# Patient Record
Sex: Female | Born: 1970 | Race: White | Hispanic: No | Smoking: Former smoker
Health system: Southern US, Community
[De-identification: ages and names within clinical notes are randomized; demographics above are authoritative.]

## PROBLEM LIST (undated history)

## (undated) DIAGNOSIS — E041 Nontoxic single thyroid nodule: Secondary | ICD-10-CM

## (undated) DIAGNOSIS — E039 Hypothyroidism, unspecified: Secondary | ICD-10-CM

## (undated) DIAGNOSIS — H9202 Otalgia, left ear: Secondary | ICD-10-CM

## (undated) DIAGNOSIS — N83209 Unspecified ovarian cyst, unspecified side: Secondary | ICD-10-CM

## (undated) DIAGNOSIS — T1490XA Injury, unspecified, initial encounter: Secondary | ICD-10-CM

## (undated) DIAGNOSIS — F909 Attention-deficit hyperactivity disorder, unspecified type: Secondary | ICD-10-CM

## (undated) DIAGNOSIS — R0602 Shortness of breath: Secondary | ICD-10-CM

## (undated) DIAGNOSIS — Z1509 Genetic susceptibility to other malignant neoplasm: Secondary | ICD-10-CM

## (undated) DIAGNOSIS — G43909 Migraine, unspecified, not intractable, without status migrainosus: Secondary | ICD-10-CM

## (undated) HISTORY — DX: Migraine, unspecified, not intractable, without status migrainosus: G43.909

## (undated) HISTORY — DX: Nontoxic single thyroid nodule: E04.1

## (undated) HISTORY — DX: Genetic susceptibility to other malignant neoplasm: Z15.09

## (undated) HISTORY — DX: Hypothyroidism, unspecified: E03.9

## (undated) HISTORY — DX: Unspecified ovarian cyst, unspecified side: N83.209

## (undated) HISTORY — PX: OTHER SURGICAL HISTORY: SHX169

---

## 1998-10-14 ENCOUNTER — Emergency Department (HOSPITAL_COMMUNITY): Admission: EM | Admit: 1998-10-14 | Discharge: 1998-10-14 | Payer: Self-pay | Admitting: Emergency Medicine

## 2000-12-27 ENCOUNTER — Encounter: Payer: Self-pay | Admitting: Family Medicine

## 2000-12-27 ENCOUNTER — Encounter: Admission: RE | Admit: 2000-12-27 | Discharge: 2000-12-27 | Payer: Self-pay | Admitting: Family Medicine

## 2001-04-23 HISTORY — PX: TUBAL LIGATION: SHX77

## 2001-09-24 ENCOUNTER — Other Ambulatory Visit: Admission: RE | Admit: 2001-09-24 | Discharge: 2001-09-24 | Payer: Self-pay | Admitting: Obstetrics and Gynecology

## 2002-01-22 ENCOUNTER — Encounter: Payer: Self-pay | Admitting: Obstetrics and Gynecology

## 2002-01-22 ENCOUNTER — Ambulatory Visit (HOSPITAL_COMMUNITY): Admission: RE | Admit: 2002-01-22 | Discharge: 2002-01-22 | Payer: Self-pay | Admitting: Obstetrics and Gynecology

## 2002-03-23 ENCOUNTER — Inpatient Hospital Stay (HOSPITAL_COMMUNITY): Admission: AD | Admit: 2002-03-23 | Discharge: 2002-03-25 | Payer: Self-pay | Admitting: Obstetrics and Gynecology

## 2002-03-23 ENCOUNTER — Encounter (INDEPENDENT_AMBULATORY_CARE_PROVIDER_SITE_OTHER): Payer: Self-pay | Admitting: Specialist

## 2002-04-23 HISTORY — PX: VEIN SURGERY: SHX48

## 2002-06-25 ENCOUNTER — Other Ambulatory Visit: Admission: RE | Admit: 2002-06-25 | Discharge: 2002-06-25 | Payer: Self-pay | Admitting: Obstetrics and Gynecology

## 2005-04-23 HISTORY — PX: ENDOMETRIAL ABLATION: SHX621

## 2007-03-26 ENCOUNTER — Encounter: Admission: RE | Admit: 2007-03-26 | Discharge: 2007-03-26 | Payer: Self-pay | Admitting: Gastroenterology

## 2010-02-20 ENCOUNTER — Emergency Department (HOSPITAL_BASED_OUTPATIENT_CLINIC_OR_DEPARTMENT_OTHER): Admission: EM | Admit: 2010-02-20 | Discharge: 2010-02-20 | Payer: Self-pay | Admitting: Emergency Medicine

## 2010-02-20 ENCOUNTER — Ambulatory Visit: Payer: Self-pay | Admitting: Diagnostic Radiology

## 2010-09-08 NOTE — Op Note (Signed)
   NAME:  [name], Jordan L                         ACCOUNT NO.:  [id number]   MEDICAL RECORD NO.:  [id number]                   PATIENT TYPE:  INP   LOCATION:  9118                                 FACILITY:  [facility]   PHYSICIAN:  Michelle L. Vincente Poli, M.D.            DATE OF BIRTH:  September 26, 1970   DATE OF PROCEDURE:  03/23/2002  DATE OF DISCHARGE:                                 OPERATIVE REPORT   PREOPERATIVE DIAGNOSIS:  Multiparity, desires permanent sterilization.   POSTOPERATIVE DIAGNOSIS:  Multiparity, desires permanent sterilization.   PROCEDURE:  Modified Pomeroy bilateral tubal ligation.   SURGEON:  Michelle L. Vincente Poli, M.D.   ANESTHESIA:  Epidural.   ESTIMATED BLOOD LOSS:  Minimal.   DESCRIPTION OF PROCEDURE:  The patient was taken to the operating room.  Her  epidural was dosed.  The abdomen was prepped and draped in the usual sterile  fashion after an in-and-out catheter was used to empty the bladder.  Using  the scalpel, small infraumbilical incision was made and the fascia was  picked up and entered using Mayo scissors.  After the peritoneum was then  opened sharply, we then used direct visualization to initially grasp the  left fallopian tube with a Babcock clamp.  It was followed to the fimbriated  end.  The midportion of the tube was then identified and lifted up using a  Babcock clamp, and a 3 cm knuckle was tied off using plain gut suture x2.  The knuckle was excised.  It was inspected and noted to be hemostatic.  The  tube was returned to the abdomen and in a likewise fashion, the right  fallopian tube was identified.  The fimbriated end was visualized easily.  The midportion of the tube was grasped using a Babcock clamp.  A 3 cm  knuckle was tied off using plain gut suture x2, and the knuckle was excised  using Metzenbaum scissors.  The tube was inspected and noted to be  hemostatic and was returned to the abdomen.  The fascia was closed using 0  Vicryl in a continuous  running stitch and the subcutaneous layer was closed,  using 3-0 Vicryl in a subcuticular stitch.  At the end of the procedure all  sponge, lap, and instrument counts were correct x2.  The patient tolerated  the procedure well and went to the recovery room in stable condition.                                               Michelle L. Vincente Poli, M.D.    [name]  D:  03/23/2002  T:  03/24/2002  Job:  [id number]

## 2011-02-18 DIAGNOSIS — T1490XA Injury, unspecified, initial encounter: Secondary | ICD-10-CM

## 2011-02-18 HISTORY — DX: Injury, unspecified, initial encounter: T14.90XA

## 2011-04-30 ENCOUNTER — Other Ambulatory Visit (HOSPITAL_COMMUNITY): Payer: Self-pay | Admitting: Obstetrics and Gynecology

## 2011-04-30 DIAGNOSIS — E041 Nontoxic single thyroid nodule: Secondary | ICD-10-CM

## 2011-05-02 ENCOUNTER — Ambulatory Visit (HOSPITAL_COMMUNITY)
Admission: RE | Admit: 2011-05-02 | Discharge: 2011-05-02 | Disposition: A | Discharge status: Home / Self Care | Payer: BC Managed Care – PPO | Source: Ambulatory Visit | Attending: Obstetrics and Gynecology | Admitting: Obstetrics and Gynecology

## 2011-05-02 DIAGNOSIS — R05 Cough: Secondary | ICD-10-CM | POA: Insufficient documentation

## 2011-05-02 DIAGNOSIS — E041 Nontoxic single thyroid nodule: Secondary | ICD-10-CM

## 2011-05-02 DIAGNOSIS — R059 Cough, unspecified: Secondary | ICD-10-CM | POA: Insufficient documentation

## 2011-05-08 ENCOUNTER — Other Ambulatory Visit: Payer: Self-pay | Admitting: Obstetrics and Gynecology

## 2011-05-08 DIAGNOSIS — E041 Nontoxic single thyroid nodule: Secondary | ICD-10-CM

## 2011-05-09 ENCOUNTER — Other Ambulatory Visit (HOSPITAL_COMMUNITY)
Admission: RE | Admit: 2011-05-09 | Discharge: 2011-05-09 | Disposition: A | Discharge status: Home / Self Care | Payer: BC Managed Care – PPO | Source: Ambulatory Visit | Attending: Interventional Radiology | Admitting: Interventional Radiology

## 2011-05-09 ENCOUNTER — Ambulatory Visit
Admission: RE | Admit: 2011-05-09 | Discharge: 2011-05-09 | Disposition: A | Discharge status: Home / Self Care | Payer: BC Managed Care – PPO | Source: Ambulatory Visit | Attending: Obstetrics and Gynecology | Admitting: Obstetrics and Gynecology

## 2011-05-09 DIAGNOSIS — R6889 Other general symptoms and signs: Secondary | ICD-10-CM | POA: Insufficient documentation

## 2011-05-09 DIAGNOSIS — E041 Nontoxic single thyroid nodule: Secondary | ICD-10-CM

## 2011-05-25 ENCOUNTER — Encounter (INDEPENDENT_AMBULATORY_CARE_PROVIDER_SITE_OTHER): Payer: Self-pay | Admitting: Surgery

## 2011-05-28 ENCOUNTER — Ambulatory Visit (INDEPENDENT_AMBULATORY_CARE_PROVIDER_SITE_OTHER): Payer: BC Managed Care – PPO | Admitting: Surgery

## 2011-05-28 ENCOUNTER — Encounter (INDEPENDENT_AMBULATORY_CARE_PROVIDER_SITE_OTHER): Payer: Self-pay | Admitting: Surgery

## 2011-05-28 VITALS — BP 122/82 | HR 104 | Temp 97.8°F | Resp 16 | Ht 66.0 in | Wt 191.4 lb

## 2011-05-28 DIAGNOSIS — E041 Nontoxic single thyroid nodule: Secondary | ICD-10-CM | POA: Insufficient documentation

## 2011-05-28 NOTE — Patient Instructions (Signed)
Thyroid Diseases Your thyroid is a butterfly-shaped gland in your neck. It is located just above your collarbone. It is one of your endocrine glands, which make hormones. The thyroid helps set your metabolism. Metabolism is how your body gets energy from the foods you eat.  Millions of people have thyroid diseases. Women experience thyroid problems more often than men. In fact, overactive thyroid problems (hyperthyroidism) occur in 1% of all women. If you have a thyroid disease, your body may use energy more slowly or quickly than it should.  Thyroid problems also include an immune disease where your body reacts against your thyroid gland (called thyroiditis). A different problem involves lumps and bumps (called nodules) that develop in the gland. The nodules are usually, but not always, noncancerous. THE MOST COMMON THYROID PROBLEMS AND CAUSES ARE DISCUSSED BELOW There are many causes for thyroid problems. Treatment depends upon the exact diagnosis and includes trying to reset your body's metabolism to a normal rate. Hyperthyroidism Too much thyroid hormone from an overactive thyroid gland is called hyperthyroidism. In hyperthyroidism, the body's metabolism speeds up. One of the most frequent forms of hyperthyroidism is known as Graves' disease. Graves' disease tends to run in families. Although Graves' is thought to be caused by a problem with the immune system, the exact nature of the genetic problem is unknown. Hypothyroidism Too little thyroid hormone from an underactive thyroid gland is called hypothyroidism. In hypothyroidism, the body's metabolism is slowed. Several things can cause this condition. Most causes affect the thyroid gland directly and hurt its ability to make enough hormone.  Rarely, there may be a pituitary gland tumor (located near the base of the brain). The tumor can block the pituitary from producing thyroid-stimulating hormone (TSH). Your body makes TSH to stimulate the thyroid  to work properly. If the pituitary does not make enough TSH, the thyroid fails to make enough hormones needed for good health. Whether the problem is caused by thyroid conditions or by the pituitary gland, the result is that the thyroid is not making enough hormones. Hypothyroidism causes many physical and mental processes to become sluggish. The body consumes less oxygen and produces less body heat. Thyroid Nodules A thyroid nodule is a small swelling or lump in the thyroid gland. They are common. These nodules represent either a growth of thyroid tissue or a fluid-filled cyst. Both form a lump in the thyroid gland. Almost half of all people will have tiny thyroid nodules at some point in their lives. Typically, these are not noticeable until they become large and affect normal thyroid size. Larger nodules that are greater than a half inch across (about 1 centimeter) occur in about 5 percent of people. Although most nodules are not cancerous, people who have them should seek medical care to rule out cancer. Also, some thyroid nodules may produce too much thyroid hormone or become too large. Large nodules or a large gland can interfere with breathing or swallowing or may cause neck discomfort. Other problems Other thyroid problems include cancer and thyroiditis. Thyroiditis is a malfunction of the body's immune system. Normally, the immune system works to defend the body against infection and other problems. When the immune system is not working properly, it may mistakenly attack normal cells, tissues, and organs. Examples of autoimmune diseases are Hashimoto's thyroiditis (which causes low thyroid function) and Graves' disease (which causes excess thyroid function). SYMPTOMS  Symptoms vary greatly depending upon the exact type of problem with the thyroid. Hyperthyroidism-is when your thyroid is too   active and makes more thyroid hormone than your body needs. The most common cause is Graves' Disease. Too  much thyroid hormone can cause some or all of the following symptoms:  Anxiety.   Irritability.   Difficulty sleeping.   Fatigue.   A rapid or irregular heartbeat.   A fine tremor of your hands or fingers.   An increase in perspiration.   Sensitivity to heat.   Weight loss, despite normal food intake.   Brittle hair.   Enlargement of your thyroid gland (goiter).   Light menstrual periods.   Frequent bowel movements.  Graves' disease can specifically cause eye and skin problems. The skin problems involve reddening and swelling of the skin, often on your shins and on the top of your feet. Eye problems can include the following:  Excess tearing and sensation of grit or sand in either or both eyes.   Reddened or inflamed eyes.   Widening of the space between your eyelids.   Swelling of the lids and tissues around the eyes.   Light sensitivity.   Ulcers on the cornea.   Double vision.   Limited eye movements.   Blurred or reduced vision.  Hypothyroidism- is when your thyroid gland is not active enough. This is more common than hyperthyroidism. Symptoms can vary a lot depending of the severity of the hormone deficiency. Symptoms may develop over a long period of time and can include several of the following:  Fatigue.   Sluggishness.   Increased sensitivity to cold.   Constipation.   Pale, dry skin.   A puffy face.   Hoarse voice.   High blood cholesterol level.   Unexplained weight gain.   Muscle aches, tenderness and stiffness.   Pain, stiffness or swelling in your joints.   Muscle weakness.   Heavier than normal menstrual periods.   Brittle fingernails and hair.   Depression.  Thyroid Nodules - most do not cause signs or symptoms. Occasionally, some may become so large that you can feel or even see the swelling at the base of your neck. You may realize a lump or swelling is there when you are shaving or putting on makeup. Men might become  aware of a nodule when shirt collars suddenly feel too tight. Some nodules produce too much thyroid hormone. This can produce the same symptoms as hyperthyroidism (see above). Thyroid nodules are seldom cancerous. However, a nodule is more likely to be malignant (cancerous) if it:  Grows quickly or feels hard.   Causes you to become hoarse or to have trouble swallowing or breathing.   Causes enlarged lymph nodes under your jaw or in your neck.  DIAGNOSIS  Because there are so many possible thyroid conditions, your caregiver may ask for a number of tests. They will do this in order to narrow down the exact diagnosis. These tests can include:  Blood and antibody tests.   Special thyroid scans using small, safe amounts of radioactive iodine.   Ultrasound of the thyroid gland (particularly if there is a nodule or lump).   Biopsy. This is usually done with a special needle. A needle biopsy is a procedure to obtain a sample of cells from the thyroid. The tissue will be tested in a lab and examined under a microscope.  TREATMENT  Treatment depends on the exact diagnosis. Hyperthyroidism  Beta-blockers help relieve many of the symptoms.   Anti-thyroid medications prevent the thyroid from making excess hormones.   Radioactive iodine treatment can destroy overactive thyroid   cells. The iodine can permanently decrease the amount of hormone produced.   Surgery to remove the thyroid gland.   Treatments for eye problems that come from Graves' disease also include medications and special eye surgery, if felt to be appropriate.  Hypothyroidism Thyroid replacement with levothyroxine is the mainstay of treatment. Treatment with thyroid replacement is usually lifelong and will require monitoring and adjustment from time to time. Thyroid Nodules  Watchful waiting. If a small nodule causes no symptoms or signs of cancer on biopsy, then no treatment may be chosen at first. Re-exam and re-checking blood  tests would be the recommended follow-up.   Anti-thyroid medications or radioactive iodine treatment may be recommended if the nodules produce too much thyroid hormone (see Treatment for Hyperthyroidism above).   Alcohol ablation. Injections of small amounts of ethyl alcohol (ethanol) can cause a non-cancerous nodule to shrink in size.   Surgery (see Treatment for Hyperthyroidism above).  HOME CARE INSTRUCTIONS   Take medications as instructed.   Follow through on recommended testing.  SEEK MEDICAL CARE IF:   You feel that you are developing symptoms of Hyperthyroidism or Hypothyroidism as described above.   You develop a new lump/nodule in the neck/thyroid area that you had not noticed before.   You feel that you are having side effects from medicines prescribed.   You develop trouble breathing or swallowing.  SEEK IMMEDIATE MEDICAL CARE IF:   You develop a fever of 102 F (38.9 C) or higher.   You develop severe sweating.   You develop palpitations and/or rapid heart beat.   You develop shortness of breath.   You develop nausea and vomiting.   You develop extreme shakiness.   You develop agitation.   You develop lightheadedness or have a fainting episode.  Document Released: 02/04/2007 Document Revised: 12/20/2010 Document Reviewed: 02/04/2007 Exit[employer] Patient Information 2012 [employer]. 
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
## 2011-05-29 ENCOUNTER — Encounter (INDEPENDENT_AMBULATORY_CARE_PROVIDER_SITE_OTHER): Payer: Self-pay

## 2011-06-19 ENCOUNTER — Inpatient Hospital Stay (HOSPITAL_COMMUNITY): Admission: RE | Admit: 2011-06-19 | Payer: BC Managed Care – PPO | Source: Ambulatory Visit

## 2011-06-20 ENCOUNTER — Encounter (HOSPITAL_COMMUNITY): Payer: Self-pay | Admitting: Pharmacy Technician

## 2011-06-20 ENCOUNTER — Ambulatory Visit (HOSPITAL_COMMUNITY)
Admission: RE | Admit: 2011-06-20 | Discharge: 2011-06-20 | Disposition: A | Discharge status: Home / Self Care | Payer: BC Managed Care – PPO | Source: Ambulatory Visit | Attending: Surgery | Admitting: Surgery

## 2011-06-20 ENCOUNTER — Encounter (HOSPITAL_COMMUNITY)
Admission: RE | Admit: 2011-06-20 | Discharge: 2011-06-20 | Disposition: A | Discharge status: Home / Self Care | Payer: BC Managed Care – PPO | Source: Ambulatory Visit | Attending: Surgery | Admitting: Surgery

## 2011-06-20 ENCOUNTER — Encounter (HOSPITAL_COMMUNITY): Payer: Self-pay

## 2011-06-20 DIAGNOSIS — E041 Nontoxic single thyroid nodule: Secondary | ICD-10-CM | POA: Insufficient documentation

## 2011-06-20 DIAGNOSIS — Z01818 Encounter for other preprocedural examination: Secondary | ICD-10-CM | POA: Insufficient documentation

## 2011-06-20 DIAGNOSIS — Z01812 Encounter for preprocedural laboratory examination: Secondary | ICD-10-CM | POA: Insufficient documentation

## 2011-06-20 HISTORY — DX: Attention-deficit hyperactivity disorder, unspecified type: F90.9

## 2011-06-20 HISTORY — DX: Injury, unspecified, initial encounter: T14.90XA

## 2011-06-20 HISTORY — DX: Otalgia, left ear: H92.02

## 2011-06-20 HISTORY — DX: Shortness of breath: R06.02

## 2011-06-20 LAB — CBC
HCT: 44 % (ref 36.0–46.0)
MCH: 33 pg (ref 26.0–34.0)
MCV: 94.2 fL (ref 78.0–100.0)
Platelets: 316 10*3/uL (ref 150–400)
RBC: 4.67 MIL/uL (ref 3.87–5.11)

## 2011-06-20 LAB — BASIC METABOLIC PANEL
BUN: 7 mg/dL (ref 6–23)
CO2: 28 mEq/L (ref 19–32)
Calcium: 9.9 mg/dL (ref 8.4–10.5)
Creatinine, Ser: 0.79 mg/dL (ref 0.50–1.10)
Glucose, Bld: 89 mg/dL (ref 70–99)

## 2011-06-20 NOTE — Patient Instructions (Signed)
20 Jordan [name]  06/20/2011   Your procedure is scheduled on:  Tuesday 3/5  AT 11:45 AM  Report to [facility] at 9:45 AM.  Call this number if you have problems the morning of surgery: [phone]   Remember:   Do not eat food OR DRINK ANYTHING AFTER MIDNIGHT THE NIGHT BEFORE YOUR SURGERY.    Take these medicines the morning of surgery with A SIP OF WATER: DO NOT TAKE ANY MEDS THE DAY OF YOUR SURGERY.   Do not wear jewelry, make-up or nail polish.  Do not wear lotions, powders, or perfumes.   Do not shave 48 hours prior to surgery.  Do not bring valuables to the hospital.  Contacts, dentures or bridgework may not be worn into surgery.  Leave suitcase in the car. After surgery it may be brought to your room.  For patients admitted to the hospital, checkout time is 11:00 AM the day of discharge.   Patients discharged the day of surgery will not be allowed to drive home.    Special Instructions: CHG Shower Use Special Wash: 1/2 bottle night before surgery and 1/2 bottle morning of surgery.   Please read over the following fact sheets that you were given: MRSA Information
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
## 2011-06-26 ENCOUNTER — Encounter (HOSPITAL_COMMUNITY): Payer: Self-pay | Admitting: Anesthesiology

## 2011-06-26 ENCOUNTER — Ambulatory Visit (HOSPITAL_COMMUNITY)
Admission: RE | Admit: 2011-06-26 | Discharge: 2011-06-27 | Disposition: A | Discharge status: Home / Self Care | Payer: BC Managed Care – PPO | Source: Ambulatory Visit | Attending: Surgery | Admitting: Surgery

## 2011-06-26 ENCOUNTER — Ambulatory Visit (HOSPITAL_COMMUNITY): Payer: BC Managed Care – PPO | Admitting: Anesthesiology

## 2011-06-26 ENCOUNTER — Encounter (HOSPITAL_COMMUNITY): Payer: Self-pay | Admitting: *Deleted

## 2011-06-26 ENCOUNTER — Encounter (HOSPITAL_COMMUNITY)
Admission: RE | Disposition: A | Discharge status: Home / Self Care | Payer: Self-pay | Source: Ambulatory Visit | Attending: Surgery

## 2011-06-26 DIAGNOSIS — Z79899 Other long term (current) drug therapy: Secondary | ICD-10-CM | POA: Insufficient documentation

## 2011-06-26 DIAGNOSIS — F17200 Nicotine dependence, unspecified, uncomplicated: Secondary | ICD-10-CM | POA: Insufficient documentation

## 2011-06-26 DIAGNOSIS — E041 Nontoxic single thyroid nodule: Secondary | ICD-10-CM

## 2011-06-26 DIAGNOSIS — D34 Benign neoplasm of thyroid gland: Secondary | ICD-10-CM | POA: Insufficient documentation

## 2011-06-26 HISTORY — PX: THYROID LOBECTOMY: SHX420

## 2011-06-26 SURGERY — LOBECTOMY, THYROID
Anesthesia: General | Site: Neck | Laterality: Left | Wound class: Clean

## 2011-06-26 MED ORDER — HYDROMORPHONE HCL PF 1 MG/ML IJ SOLN
INTRAMUSCULAR | Status: AC
  Filled 2011-06-26: qty 1

## 2011-06-26 MED ORDER — FENTANYL CITRATE 0.05 MG/ML IJ SOLN
INTRAMUSCULAR | Status: DC | PRN
  Administered 2011-06-26 (×2): 50 ug via INTRAVENOUS
  Administered 2011-06-26: 100 ug via INTRAVENOUS
  Administered 2011-06-26: 50 ug via INTRAVENOUS

## 2011-06-26 MED ORDER — LIDOCAINE HCL (CARDIAC) 20 MG/ML IV SOLN
INTRAVENOUS | Status: DC | PRN
  Administered 2011-06-26: 50 mg via INTRAVENOUS

## 2011-06-26 MED ORDER — PROMETHAZINE HCL 25 MG/ML IJ SOLN: 6.2500 mg | INTRAMUSCULAR | Status: DC | PRN

## 2011-06-26 MED ORDER — ONDANSETRON HCL 4 MG/2ML IJ SOLN
4.0000 mg | Freq: Four times a day (QID) | INTRAMUSCULAR | Status: DC | PRN
  Administered 2011-06-26: 4 mg via INTRAVENOUS
  Filled 2011-06-26: qty 2

## 2011-06-26 MED ORDER — ROCURONIUM BROMIDE 100 MG/10ML IV SOLN
INTRAVENOUS | Status: DC | PRN
  Administered 2011-06-26: 40 mg via INTRAVENOUS
  Administered 2011-06-26: 10 mg via INTRAVENOUS

## 2011-06-26 MED ORDER — ONDANSETRON HCL 4 MG PO TABS: 4.0000 mg | ORAL_TABLET | Freq: Four times a day (QID) | ORAL | Status: DC | PRN

## 2011-06-26 MED ORDER — GLYCOPYRROLATE 0.2 MG/ML IJ SOLN
INTRAMUSCULAR | Status: DC | PRN
  Administered 2011-06-26: .7 mg via INTRAVENOUS

## 2011-06-26 MED ORDER — HYDROMORPHONE HCL PF 1 MG/ML IJ SOLN
1.0000 mg | INTRAMUSCULAR | Status: DC | PRN
  Administered 2011-06-26 (×2): 1 mg via INTRAVENOUS
  Filled 2011-06-26 (×2): qty 1

## 2011-06-26 MED ORDER — MEPERIDINE HCL 50 MG/ML IJ SOLN: 6.2500 mg | INTRAMUSCULAR | Status: DC | PRN

## 2011-06-26 MED ORDER — ACETAMINOPHEN 10 MG/ML IV SOLN
INTRAVENOUS | Status: DC | PRN
  Administered 2011-06-26: 1000 mg via INTRAVENOUS

## 2011-06-26 MED ORDER — LACTATED RINGERS IV SOLN
INTRAVENOUS | Status: DC
  Administered 2011-06-26: 1000 mL via INTRAVENOUS

## 2011-06-26 MED ORDER — 0.9 % SODIUM CHLORIDE (POUR BTL) OPTIME
TOPICAL | Status: DC | PRN
  Administered 2011-06-26: 1000 mL

## 2011-06-26 MED ORDER — LISDEXAMFETAMINE DIMESYLATE 50 MG PO CAPS: 50.0000 mg | ORAL_CAPSULE | Freq: Every day | ORAL | Status: DC

## 2011-06-26 MED ORDER — HYDROMORPHONE HCL PF 1 MG/ML IJ SOLN
0.2500 mg | INTRAMUSCULAR | Status: DC | PRN
  Administered 2011-06-26 (×3): 0.5 mg via INTRAVENOUS

## 2011-06-26 MED ORDER — CEFAZOLIN SODIUM-DEXTROSE 2-3 GM-% IV SOLR
2.0000 g | Freq: Once | INTRAVENOUS | Status: AC
  Administered 2011-06-26: 2 g via INTRAVENOUS

## 2011-06-26 MED ORDER — PROPOFOL 10 MG/ML IV BOLUS
INTRAVENOUS | Status: DC | PRN
  Administered 2011-06-26: 150 mg via INTRAVENOUS

## 2011-06-26 MED ORDER — KCL IN DEXTROSE-NACL 20-5-0.45 MEQ/L-%-% IV SOLN
INTRAVENOUS | Status: DC
  Administered 2011-06-26: 50 mL/h via INTRAVENOUS
  Filled 2011-06-26 (×3): qty 1000

## 2011-06-26 MED ORDER — HYDROCODONE-ACETAMINOPHEN 5-325 MG PO TABS
1.0000 | ORAL_TABLET | ORAL | Status: DC | PRN
  Administered 2011-06-27: 2 via ORAL
  Filled 2011-06-26 (×2): qty 2

## 2011-06-26 MED ORDER — LACTATED RINGERS IV SOLN
INTRAVENOUS | Status: DC
  Administered 2011-06-26: 15:00:00 via INTRAVENOUS

## 2011-06-26 MED ORDER — ONDANSETRON HCL 4 MG/2ML IJ SOLN
INTRAMUSCULAR | Status: DC | PRN
  Administered 2011-06-26: 4 mg via INTRAVENOUS

## 2011-06-26 MED ORDER — MIDAZOLAM HCL 5 MG/5ML IJ SOLN
INTRAMUSCULAR | Status: DC | PRN
  Administered 2011-06-26: 2 mg via INTRAVENOUS

## 2011-06-26 MED ORDER — NEOSTIGMINE METHYLSULFATE 1 MG/ML IJ SOLN
INTRAMUSCULAR | Status: DC | PRN
  Administered 2011-06-26: 4 mg via INTRAVENOUS

## 2011-06-26 MED ORDER — ACETAMINOPHEN 325 MG PO TABS: 650.0000 mg | ORAL_TABLET | ORAL | Status: DC | PRN

## 2011-06-26 MED ORDER — TRAMADOL HCL 50 MG PO TABS: 50.0000 mg | ORAL_TABLET | Freq: Four times a day (QID) | ORAL | Status: DC | PRN

## 2011-06-26 SURGICAL SUPPLY — 39 items
APL SKNCLS STERI-STRIP NONHPOA (GAUZE/BANDAGES/DRESSINGS) ×1
ATTRACTOMAT 16X20 MAGNETIC DRP (DRAPES) ×2 IMPLANT
BENZOIN TINCTURE PRP APPL 2/3 (GAUZE/BANDAGES/DRESSINGS) ×2 IMPLANT
BLADE HEX COATED 2.75 (ELECTRODE) ×2 IMPLANT
BLADE SURG 15 STRL LF DISP TIS (BLADE) ×1 IMPLANT
BLADE SURG 15 STRL SS (BLADE) ×2
CANISTER SUCTION 2500CC (MISCELLANEOUS) ×2 IMPLANT
CHLORAPREP W/TINT 10.5 ML (MISCELLANEOUS) ×2 IMPLANT
CLIP TI MEDIUM 6 (CLIP) ×4 IMPLANT
CLIP TI WIDE RED SMALL 6 (CLIP) ×4 IMPLANT
CLOSURE STERI STRIP 1/2 X4 (GAUZE/BANDAGES/DRESSINGS) ×1 IMPLANT
CLOTH BEACON ORANGE TIMEOUT ST (SAFETY) ×2 IMPLANT
DISSECTOR ROUND CHERRY 3/8 STR (MISCELLANEOUS) IMPLANT
DRAPE PED LAPAROTOMY (DRAPES) ×2 IMPLANT
DRESSING SURGICEL FIBRLLR 1X2 (HEMOSTASIS) ×1 IMPLANT
DRSG SURGICEL FIBRILLAR 1X2 (HEMOSTASIS) ×2
ELECT REM PT RETURN 9FT ADLT (ELECTROSURGICAL) ×2
ELECTRODE REM PT RTRN 9FT ADLT (ELECTROSURGICAL) ×1 IMPLANT
GAUZE SPONGE 4X4 16PLY XRAY LF (GAUZE/BANDAGES/DRESSINGS) ×2 IMPLANT
GLOVE SURG ORTHO 8.0 STRL STRW (GLOVE) ×2 IMPLANT
GOWN STRL NON-REIN LRG LVL3 (GOWN DISPOSABLE) ×2 IMPLANT
GOWN STRL REIN XL XLG (GOWN DISPOSABLE) ×4 IMPLANT
KIT BASIN OR (CUSTOM PROCEDURE TRAY) ×2 IMPLANT
NS IRRIG 1000ML POUR BTL (IV SOLUTION) ×2 IMPLANT
PACK BASIC VI WITH GOWN DISP (CUSTOM PROCEDURE TRAY) ×2 IMPLANT
PENCIL BUTTON HOLSTER BLD 10FT (ELECTRODE) ×2 IMPLANT
SHEARS HARMONIC 9CM CVD (BLADE) ×2 IMPLANT
SPONGE GAUZE 4X4 12PLY (GAUZE/BANDAGES/DRESSINGS) ×1 IMPLANT
STAPLER VISISTAT 35W (STAPLE) ×2 IMPLANT
STRIP CLOSURE SKIN 1/2X4 (GAUZE/BANDAGES/DRESSINGS) ×1 IMPLANT
SUT MNCRL AB 4-0 PS2 18 (SUTURE) ×2 IMPLANT
SUT SILK 2 0 (SUTURE) ×2
SUT SILK 2-0 18XBRD TIE 12 (SUTURE) ×1 IMPLANT
SUT SILK 3 0 (SUTURE)
SUT SILK 3-0 18XBRD TIE 12 (SUTURE) IMPLANT
SUT VIC AB 3-0 SH 18 (SUTURE) ×3 IMPLANT
SYR BULB IRRIGATION 50ML (SYRINGE) ×2 IMPLANT
TOWEL OR 17X26 10 PK STRL BLUE (TOWEL DISPOSABLE) ×2 IMPLANT
YANKAUER SUCT BULB TIP 10FT TU (MISCELLANEOUS) ×2 IMPLANT

## 2011-06-26 NOTE — Progress Notes (Signed)
Report received for lunch relief.

## 2011-06-26 NOTE — Brief Op Note (Signed)
06/26/2011  1:59 PM  PATIENT:  Jordan [name]  41 y.o. female  PRE-OPERATIVE DIAGNOSIS:  left thyroid nodule  POST-OPERATIVE DIAGNOSIS:  same  PROCEDURE:  Procedure(s) (LRB): LEFT THYROID LOBECTOMY (Left)  SURGEON:  Surgeon(s) and Role:    * Velora Heckler, MD - Primary  ASSISTANTS: none   ANESTHESIA:   general  EBL:     BLOOD ADMINISTERED:none  DRAINS: none   LOCAL MEDICATIONS USED:  NONE  SPECIMEN:  No Specimen and Excision  DISPOSITION OF SPECIMEN:  PATHOLOGY  COUNTS:  YES  TOURNIQUET:  * No tourniquets in log *  DICTATION: .Other Dictation: Dictation Number [id number]  PLAN OF CARE: Admit for overnight observation  PATIENT DISPOSITION:  PACU - hemodynamically stable.   Delay start of Pharmacological VTE agent (>24hrs) due to surgical blood loss or risk of bleeding: yes  Velora Heckler, MD, [facility] Office: [phone]

## 2011-06-26 NOTE — Interval H&P Note (Signed)
History and Physical Interval Note:  06/26/2011 11:55 AM  Jordan [name]  has presented today for surgery, with the diagnosis of thyroid nodule.  The various methods of treatment have been discussed with the patient and family. After consideration of risks, benefits and other options for treatment, the patient has consented to    Procedure(s) (LRB): THYROID LOBECTOMY (Left) as a surgical intervention .    The patients' history has been reviewed, patient examined, no change in status, stable for surgery.  I have reviewed the patients' chart and labs.  Questions were answered to the patient's satisfaction.    Velora Heckler, MD, [facility] Office: [phone]    Jordan [name] Judie Petit
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
## 2011-06-26 NOTE — Anesthesia Preprocedure Evaluation (Signed)
Anesthesia Evaluation  Patient identified by MRN, date of birth, ID band Patient awake    Reviewed: Allergy & Precautions, H&P , NPO status , Patient's Chart, lab work & pertinent test results  Airway Mallampati: II TM Distance: >3 FB Neck ROM: Full    Dental No notable dental hx.    Pulmonary neg pulmonary ROS,  breath sounds clear to auscultation  Pulmonary exam normal       Cardiovascular negative cardio ROS  Rhythm:Regular Rate:Normal     Neuro/Psych  Headaches, negative neurological ROS  negative psych ROS   GI/Hepatic negative GI ROS, Neg liver ROS,   Endo/Other  negative endocrine ROS  Renal/GU negative Renal ROS  negative genitourinary   Musculoskeletal negative musculoskeletal ROS (+)   Abdominal   Peds negative pediatric ROS (+)  Hematology negative hematology ROS (+)   Anesthesia Other Findings   Reproductive/Obstetrics negative OB ROS                           Anesthesia Physical Anesthesia Plan  ASA: II  Anesthesia Plan: General   Post-op Pain Management:    Induction: Intravenous  Airway Management Planned:   Additional Equipment:   Intra-op Plan:   Post-operative Plan: Extubation in OR  Informed Consent: I have reviewed the patients History and Physical, chart, labs and discussed the procedure including the risks, benefits and alternatives for the proposed anesthesia with the patient or authorized representative who has indicated his/her understanding and acceptance.   Dental advisory given  Plan Discussed with: CRNA  Anesthesia Plan Comments:         Anesthesia Quick Evaluation

## 2011-06-26 NOTE — Anesthesia Postprocedure Evaluation (Signed)
  Anesthesia Post-op Note  Patient: Jordan [name]  Procedure(s) Performed: Procedure(s) (LRB): THYROID LOBECTOMY (Left)  Patient Location: PACU  Anesthesia Type: General  Level of Consciousness: awake and alert   Airway and Oxygen Therapy: Patient Spontanous Breathing  Post-op Pain: mild  Post-op Assessment: Post-op Vital signs reviewed, Patient's Cardiovascular Status Stable, Respiratory Function Stable, Patent Airway and No signs of Nausea or vomiting  Post-op Vital Signs: stable  Complications: No apparent anesthesia complications

## 2011-06-26 NOTE — Transfer of Care (Signed)
Immediate Anesthesia Transfer of Care Note  Patient: Jordan [name]  Procedure(s) Performed: Procedure(s) (LRB): THYROID LOBECTOMY (Left)  Patient Location: PACU  Anesthesia Type: General  Level of Consciousness: sedated, patient cooperative and responds to stimulaton  Airway & Oxygen Therapy: Patient Spontanous Breathing and Patient connected to face mask oxgen  Post-op Assessment: Report given to PACU RN and Post -op Vital signs reviewed and stable  Post vital signs: Reviewed and stable  Complications: No apparent anesthesia complications

## 2011-06-27 ENCOUNTER — Other Ambulatory Visit (INDEPENDENT_AMBULATORY_CARE_PROVIDER_SITE_OTHER): Payer: Self-pay

## 2011-06-27 ENCOUNTER — Telehealth (INDEPENDENT_AMBULATORY_CARE_PROVIDER_SITE_OTHER): Payer: Self-pay

## 2011-06-27 MED ORDER — HYDROCODONE-ACETAMINOPHEN 5-325 MG PO TABS: 1.0000 | ORAL_TABLET | ORAL | Status: AC | PRN

## 2011-06-27 NOTE — Telephone Encounter (Signed)
Patient did not receive prescription at d/c from hospital for [facility] 5/325mg , reviewed with Dr. Gerrit Friends, okay to call in medication to pharmacy.  Called in Norco 5/325mg , 1-2 tablets po, q4 hrs prn for pain, #20, 0 refills.

## 2011-06-27 NOTE — Discharge Summary (Signed)
Physician Discharge Summary [facility]  Patient ID: MAKEYLA GOVAN MRN: [id number] DOB/AGE: February 24, 1971 41 y.o.  Admit date: 06/26/2011 Discharge date: 06/27/2011  Admission Diagnoses:  Left thyroid nodule, follicular  Discharge Diagnoses:  Active Problems:  * No active hospital problems. *    Discharged Condition: good  Hospital Course: Patient admitted following left thyroid lobectomy for observation.  Remained stable overnight with mild pain.  Tolerated diet.  Prepared for discharge post op day #1.  Consults: None  Significant Diagnostic Studies: none  Treatments: surgery: left thyroid lobectomy  Discharge Exam: Blood pressure 112/69, pulse 69, temperature 97.6 F (36.4 C), temperature source Oral, resp. rate 18, height 5\' 7"  (1.702 m), weight 187 lb 6.3 oz (85 kg), SpO2 98.00%. HEENT - clear Neck - mild soft tissue swelling; wound clear and intact; voice normal Chest - clear bilat  Disposition: Home with family  Discharge Orders    Future Appointments: Provider: Department: Dept Phone: Center:   07/09/2011 2:00 PM Velora Heckler, MD [facility] [phone] None     Future Orders Please Complete By Expires   Diet - low sodium heart healthy      Increase activity slowly      Discharge instructions      Comments:   [facility], [place] [phone]  THYROID & PARATHYROID SURGERY -- POST OP INSTRUCTIONS  Always review your discharge instruction sheet from the facility where your surgery was performed.  A prescription for pain medication may be given to you upon discharge.  Take your pain medication as prescribed, if needed.  If narcotic pain medicine is not needed, then you may take acetaminophen (Tylenol) or ibuprofen (Advil) as needed. Take your usually prescribed medications unless otherwise directed. If you need a refill on your pain medication, please contact your pharmacy. They will contact our office to request authorization.   Prescriptions will not be processed after 5 pm or on weekends. Start with a light diet upon arrival home, such as soup and crackers, etc.  Be sure to drink pleny of fluids daily.  Resume your normal diet the day after surgery. Most patients will experience some swelling and bruising on the chest and neck area.  Ice packs will help.  Swelling and bruising can take several days to resolve.  It is common to experience some constipation if taking pain medication after surgery.  Increasing fluid intake and taking a stool softener will usually help or prevent this problem.  A mild laxative (Milk of Magnesia or Miralax) should be taken according to package directions if there are no bowel movements after 48 hours. You may remove your bandages 24-48 hours after surgery, and you may shower at that time.  You have steri-strips (small skin tapes) in place directly over the incision.  These strips should be left on the skin for 7-10 days and then removed. You may resume regular (light) daily activities beginning the next day--such as daily self-care, walking, climbing stairs--gradually increasing activities as tolerated.  You may have sexual intercourse when it is comfortable.  Refrain from any heavy lifting or straining until approved by your doctor.  You may drive when you no longer are taking prescription pain medication, you can comfortably wear a seatbelt, and you can safely maneuver your car and apply brakes. You should see your doctor in the office for a follow-up appointment approximately two weeks after your surgery.  Make sure that you call for this appointment within a day or two after you  arrive home to insure a convenient appointment time.  WHEN TO CALL YOUR DOCTOR: Fever over 101.5 Inability to urinate Nausea and/or vomiting - persistent Extreme swelling or bruising Continued bleeding from incision Increased pain, redness, or drainage from the incision Difficulty swallowing or breathing Muscle  cramping or spasms Numbness or tingling in hands or feet or around lips  The clinic staff is available to answer your questions during regular business hours.  Please don't hesitate to call and ask to speak to one of the nurses if you have concerns.  [email or web address]    Remove dressing in 24 hours        Medication List  As of 06/27/2011  8:02 AM   TAKE these medications         HYDROcodone-acetaminophen 5-325 MG per tablet   Commonly known as: NORCO   Take 1-2 tablets by mouth every 4 (four) hours as needed.      ibuprofen 200 MG tablet   Commonly known as: ADVIL,MOTRIN   Take 800 mg by mouth every 6 (six) hours as needed. Pain        levonorgestrel 20 MCG/24HR IUD   Commonly known as: MIRENA   1 each by Intrauterine route once.      lisdexamfetamine 50 MG capsule   Commonly known as: VYVANSE   Take 50 mg by mouth every morning.      tetrahydrozoline-zinc 0.05-0.25 % ophthalmic solution   Commonly known as: VISINE-AC   Place 2 drops into both eyes 3 (three) times daily as needed. Allergies           ASK your doctor about these medications         traMADol 50 MG tablet   Commonly known as: ULTRAM   Take 50 mg by mouth every 6 (six) hours as needed. Pain             Velora Heckler, MD, [facility] Office: [phone]    Signed: Velora Heckler 06/27/2011, 8:02 AM
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
## 2011-06-28 NOTE — Progress Notes (Signed)
Patient aware.

## 2011-06-28 NOTE — Progress Notes (Signed)
Quick Note:  Please contact patient with benign path results. TMG ______ 

## 2011-07-02 ENCOUNTER — Telehealth (INDEPENDENT_AMBULATORY_CARE_PROVIDER_SITE_OTHER): Payer: Self-pay | Admitting: Surgery

## 2011-07-02 NOTE — Telephone Encounter (Signed)
Pt calling in to report "bulging" at incision site.  She denies redness and fever; states only a little pain at the incision site.  She is just concerned today with apparent new swelling.  Pt reassured and councelled that as generalized swelling resolves, the swelling at the incision site will seem more pronounced.  Continue to monitor site and call back if it worsens, fever develops, or any difficulty swallowing.

## 2011-07-09 ENCOUNTER — Other Ambulatory Visit (INDEPENDENT_AMBULATORY_CARE_PROVIDER_SITE_OTHER): Payer: Self-pay

## 2011-07-09 ENCOUNTER — Ambulatory Visit (INDEPENDENT_AMBULATORY_CARE_PROVIDER_SITE_OTHER): Payer: BC Managed Care – PPO | Admitting: Surgery

## 2011-07-09 ENCOUNTER — Encounter (INDEPENDENT_AMBULATORY_CARE_PROVIDER_SITE_OTHER): Payer: Self-pay | Admitting: Surgery

## 2011-07-09 VITALS — BP 142/88 | HR 88 | Temp 99.0°F | Resp 16 | Ht 66.0 in | Wt 196.8 lb

## 2011-07-09 DIAGNOSIS — E041 Nontoxic single thyroid nodule: Secondary | ICD-10-CM

## 2011-07-09 NOTE — Progress Notes (Signed)
Visit Diagnoses: 1. Thyroid nodule, uninodular     HISTORY: Patient returns for her first postoperative visit having undergone left thyroid lobectomy. Final pathology shows a follicular adenoma. No malignancy was identified.  EXAM: Surgical incision is healing nicely. Mild soft tissue swelling. No seroma. No sign of infection. Voice quality is normal.  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 We will check a TSH level in 4 weeks. If needed we will start her on thyroid hormone supplements at that time. Patient will return in 6 weeks for final wound check and review of her laboratory studies.  Velora Heckler, MD, FACS General & Endocrine Surgery [facility]

## 2011-07-09 NOTE — Patient Instructions (Signed)
  COCOA BUTTER & VITAMIN E CREAM  ([employer]mer's or other brand)  Apply cocoa butter/vitamin E cream to your incision 2 - 3 times daily.  Massage cream into incision for one minute with each application.  Use sunscreen (50 SPF or higher) for first 6 months after surgery if area is exposed to sun.  You may substitute Mederma or other scar reducing creams as desired.   

## 2011-07-18 ENCOUNTER — Encounter (HOSPITAL_COMMUNITY): Payer: Self-pay | Admitting: Surgery

## 2011-08-09 ENCOUNTER — Encounter (INDEPENDENT_AMBULATORY_CARE_PROVIDER_SITE_OTHER): Payer: BC Managed Care – PPO | Admitting: Surgery

## 2011-08-29 ENCOUNTER — Ambulatory Visit (INDEPENDENT_AMBULATORY_CARE_PROVIDER_SITE_OTHER): Payer: BC Managed Care – PPO | Admitting: Surgery

## 2011-08-29 ENCOUNTER — Encounter (INDEPENDENT_AMBULATORY_CARE_PROVIDER_SITE_OTHER): Payer: Self-pay | Admitting: Surgery

## 2011-08-29 VITALS — BP 128/84 | HR 68 | Temp 96.5°F | Resp 18 | Ht 66.0 in | Wt 197.1 lb

## 2011-08-29 DIAGNOSIS — E041 Nontoxic single thyroid nodule: Secondary | ICD-10-CM

## 2011-08-29 NOTE — Patient Instructions (Signed)
  COCOA BUTTER & VITAMIN E CREAM  ([employer]mer's or other brand)  Apply cocoa butter/vitamin E cream to your incision 2 - 3 times daily.  Massage cream into incision for one minute with each application.  Use sunscreen (50 SPF or higher) for first 6 months after surgery if area is exposed to sun.  You may substitute Mederma or other scar reducing creams as desired.   

## 2011-08-29 NOTE — Progress Notes (Signed)
Visit Diagnoses: 1. Thyroid nodule, uninodular     HISTORY: The patient returns for postoperative visit having undergone thyroid lobectomy. Final pathology was benign. Followup TSH level from August 21, 2011 is normal at 4.129.  Patient does note continued fatigue and mild depression. She also notes some mild voice quality changes.  EXAM: Surgical wound is well-healed. Cosmetic result will be fine. Mild soft tissue swelling. Voice quality is normal.  IMPRESSION: Status post thyroid lobectomy with benign final pathology  PLAN: The patient has been referred to endocrinology for thyroid hormone supplementation management. She is currently taking Synthroid 25 mcg daily. She is to return to the endocrine practice for followup in 6 weeks with laboratory work at that time.  Patient will continue to apply topical creams to her incision. She will use sunscreen this summer. She will return to see me in this office as needed.  Velora Heckler, MD, FACS General & Endocrine Surgery [facility]

## 2011-10-11 ENCOUNTER — Encounter (INDEPENDENT_AMBULATORY_CARE_PROVIDER_SITE_OTHER): Payer: Self-pay

## 2011-11-13 ENCOUNTER — Other Ambulatory Visit: Payer: Self-pay | Admitting: Obstetrics and Gynecology

## 2011-11-16 ENCOUNTER — Other Ambulatory Visit: Payer: Self-pay | Admitting: Obstetrics and Gynecology

## 2011-11-16 DIAGNOSIS — R928 Other abnormal and inconclusive findings on diagnostic imaging of breast: Secondary | ICD-10-CM

## 2011-11-22 ENCOUNTER — Ambulatory Visit
Admission: RE | Admit: 2011-11-22 | Discharge: 2011-11-22 | Disposition: A | Discharge status: Home / Self Care | Payer: BC Managed Care – PPO | Source: Ambulatory Visit | Attending: Obstetrics and Gynecology | Admitting: Obstetrics and Gynecology

## 2011-11-22 DIAGNOSIS — R928 Other abnormal and inconclusive findings on diagnostic imaging of breast: Secondary | ICD-10-CM

## 2012-08-22 ENCOUNTER — Telehealth: Payer: Self-pay | Admitting: Nurse Practitioner

## 2012-08-25 ENCOUNTER — Other Ambulatory Visit: Payer: Self-pay | Admitting: *Deleted

## 2012-08-25 MED ORDER — LISDEXAMFETAMINE DIMESYLATE 50 MG PO CAPS: 50.0000 mg | ORAL_CAPSULE | ORAL | Status: DC

## 2012-08-25 NOTE — Telephone Encounter (Signed)
Patient last seen in office on 3-3. Rx last filled on 4-4 for #30. Please advise. If approved please print and have pt picked up.

## 2012-08-25 NOTE — Telephone Encounter (Signed)
rx ready for pickup 

## 2012-08-26 NOTE — Telephone Encounter (Signed)
Pt notified that rx up front ready to pick up

## 2012-09-03 NOTE — Telephone Encounter (Signed)
DONE

## 2012-09-05 ENCOUNTER — Other Ambulatory Visit: Payer: BC Managed Care – PPO

## 2012-09-05 DIAGNOSIS — R5381 Other malaise: Secondary | ICD-10-CM

## 2012-09-05 DIAGNOSIS — E039 Hypothyroidism, unspecified: Secondary | ICD-10-CM

## 2012-09-05 DIAGNOSIS — F909 Attention-deficit hyperactivity disorder, unspecified type: Secondary | ICD-10-CM

## 2012-09-22 LAB — PREGNENOLONE LC-MS/MS: Pregnenolone: 46 ng/dL

## 2012-10-13 ENCOUNTER — Other Ambulatory Visit: Payer: Self-pay | Admitting: Nurse Practitioner

## 2012-10-13 MED ORDER — LISDEXAMFETAMINE DIMESYLATE 50 MG PO CAPS: 50.0000 mg | ORAL_CAPSULE | ORAL | Status: DC

## 2012-10-13 NOTE — Telephone Encounter (Signed)
LAST RF 09/09/12.  PLEASE PRINT AND CALL PT TO PICKUP RX WHEN READY. THANKS

## 2012-10-13 NOTE — Telephone Encounter (Signed)
rx ready for pickup 

## 2012-10-14 NOTE — Telephone Encounter (Signed)
Pt aware of rx

## 2012-11-14 ENCOUNTER — Other Ambulatory Visit: Payer: Self-pay | Admitting: *Deleted

## 2012-11-14 NOTE — Telephone Encounter (Signed)
Patient last seen in office on 06-23-12. Rx last filled on 10-14-12. Please advise. If approved please print and have nurse call patient to come pick up

## 2012-11-16 MED ORDER — LISDEXAMFETAMINE DIMESYLATE 50 MG PO CAPS: 50.0000 mg | ORAL_CAPSULE | ORAL | Status: DC

## 2012-11-16 NOTE — Telephone Encounter (Signed)
rx ready for pickup 

## 2012-11-20 NOTE — Telephone Encounter (Signed)
Pt has picked up script 

## 2012-12-03 ENCOUNTER — Telehealth: Payer: Self-pay | Admitting: *Deleted

## 2012-12-03 MED ORDER — LISDEXAMFETAMINE DIMESYLATE 50 MG PO CAPS: 50.0000 mg | ORAL_CAPSULE | ORAL | Status: DC

## 2012-12-03 NOTE — Telephone Encounter (Signed)
Patient aware rx up front 

## 2012-12-03 NOTE — Telephone Encounter (Signed)
rx ready for pickup 

## 2012-12-03 NOTE — Telephone Encounter (Signed)
Pt never got a vyvance script in July. She got the last one in June.  She has called [place] about it but never heard back.

## 2013-01-02 ENCOUNTER — Other Ambulatory Visit: Payer: Self-pay

## 2013-01-02 MED ORDER — LISDEXAMFETAMINE DIMESYLATE 50 MG PO CAPS: 50.0000 mg | ORAL_CAPSULE | ORAL | Status: DC

## 2013-01-02 NOTE — Telephone Encounter (Signed)
rx ready for pickup 

## 2013-01-02 NOTE — Telephone Encounter (Signed)
Not seen in EPIC  Last filled 12/03/12   If approved print and route to nurse

## 2013-01-05 ENCOUNTER — Telehealth: Payer: Self-pay | Admitting: Nurse Practitioner

## 2013-01-06 NOTE — Telephone Encounter (Signed)
Called patient aware rx up front to pick up

## 2013-02-02 ENCOUNTER — Other Ambulatory Visit: Payer: Self-pay

## 2013-02-02 MED ORDER — LISDEXAMFETAMINE DIMESYLATE 50 MG PO CAPS: 50.0000 mg | ORAL_CAPSULE | ORAL | Status: DC

## 2013-02-02 NOTE — Telephone Encounter (Signed)
Last seen 06/23/12  MMM  If approved print and route to nurse

## 2013-02-02 NOTE — Telephone Encounter (Signed)
rx ready for pick up NTBS for future refills 

## 2013-02-03 NOTE — Telephone Encounter (Signed)
Up front 

## 2013-03-05 ENCOUNTER — Other Ambulatory Visit: Payer: Self-pay | Admitting: Nurse Practitioner

## 2013-03-05 ENCOUNTER — Telehealth: Payer: Self-pay | Admitting: Nurse Practitioner

## 2013-03-05 MED ORDER — LISDEXAMFETAMINE DIMESYLATE 50 MG PO CAPS: 50.0000 mg | ORAL_CAPSULE | ORAL | Status: DC

## 2013-03-05 NOTE — Telephone Encounter (Signed)
Patient aware.

## 2013-03-05 NOTE — Telephone Encounter (Signed)
rx ready for pickup 

## 2013-04-02 ENCOUNTER — Other Ambulatory Visit: Payer: Self-pay | Admitting: Obstetrics and Gynecology

## 2013-04-03 ENCOUNTER — Other Ambulatory Visit: Payer: Self-pay

## 2013-04-03 MED ORDER — LISDEXAMFETAMINE DIMESYLATE 50 MG PO CAPS: 50.0000 mg | ORAL_CAPSULE | ORAL | Status: DC

## 2013-04-03 NOTE — Telephone Encounter (Signed)
Patient aware and she is aware that she must be seen

## 2013-04-03 NOTE — Telephone Encounter (Signed)
No mre refills without being seen

## 2013-04-03 NOTE — Telephone Encounter (Signed)
Last seen 06/23/12  MMM  IF approved route to nurse to print

## 2013-05-05 ENCOUNTER — Other Ambulatory Visit: Payer: Self-pay | Admitting: Obstetrics and Gynecology

## 2013-05-05 DIAGNOSIS — N644 Mastodynia: Secondary | ICD-10-CM

## 2013-05-12 ENCOUNTER — Ambulatory Visit
Admission: RE | Admit: 2013-05-12 | Discharge: 2013-05-12 | Disposition: A | Discharge status: Home / Self Care | Payer: BC Managed Care – PPO | Source: Ambulatory Visit | Attending: Obstetrics and Gynecology | Admitting: Obstetrics and Gynecology

## 2013-05-12 DIAGNOSIS — N644 Mastodynia: Secondary | ICD-10-CM

## 2013-05-27 ENCOUNTER — Other Ambulatory Visit: Payer: Self-pay | Admitting: Family Medicine

## 2013-05-27 MED ORDER — LISDEXAMFETAMINE DIMESYLATE 50 MG PO CAPS: 50.0000 mg | ORAL_CAPSULE | ORAL | Status: DC

## 2013-06-24 ENCOUNTER — Other Ambulatory Visit: Payer: Self-pay

## 2013-06-29 ENCOUNTER — Other Ambulatory Visit (INDEPENDENT_AMBULATORY_CARE_PROVIDER_SITE_OTHER): Payer: BC Managed Care – PPO

## 2013-06-29 ENCOUNTER — Telehealth: Payer: Self-pay | Admitting: Nurse Practitioner

## 2013-06-29 DIAGNOSIS — R5383 Other fatigue: Secondary | ICD-10-CM

## 2013-06-29 DIAGNOSIS — R5381 Other malaise: Secondary | ICD-10-CM

## 2013-06-29 DIAGNOSIS — E039 Hypothyroidism, unspecified: Secondary | ICD-10-CM

## 2013-06-29 NOTE — Telephone Encounter (Signed)
Appt April 6 with Ronnald Collum. Can you refill Vyvanse until then.

## 2013-06-29 NOTE — Telephone Encounter (Signed)
Cannot have refill without being seen- make appt please.

## 2013-06-29 NOTE — Telephone Encounter (Signed)
appt made

## 2013-06-29 NOTE — Telephone Encounter (Signed)
Please advise 

## 2013-06-29 NOTE — Telephone Encounter (Signed)
Pt notified will need appt

## 2013-06-30 LAB — B12 AND FOLATE PANEL
Folate: 12.9 ng/mL (ref >3.0)
Vitamin B-12: 288 pg/mL (ref 211–946)

## 2013-06-30 LAB — IRON AND TIBC
IRON SATURATION: 44 % (ref 15–55)
IRON: 109 ug/dL (ref 35–155)
TIBC: 250 ug/dL (ref 250–450)
UIBC: 141 ug/dL — ABNORMAL LOW (ref 150–375)

## 2013-06-30 LAB — VITAMIN D 25 HYDROXY (VIT D DEFICIENCY, FRACTURES): VIT D 25 HYDROXY: 29.6 ng/mL — AB (ref 30.0–100.0)

## 2013-06-30 LAB — T3, FREE: T3, Free: 4.4 pg/mL (ref 2.0–4.4)

## 2013-06-30 LAB — PROGESTERONE: PROGESTERONE: 8.3 ng/mL

## 2013-06-30 LAB — T4, FREE: Free T4: 1.12 ng/dL (ref 0.82–1.77)

## 2013-06-30 NOTE — Telephone Encounter (Signed)
Patient concerned that labs may not have been sent to endocrinologist in [place].  Please verify thart this was done.

## 2013-06-30 NOTE — Telephone Encounter (Signed)
Message copied by Shelbie Ammons on Tue Jun 30, 2013  4:49 PM ------      Message from: Chevis Pretty      Created: Tue Jun 30, 2013  1:23 PM       Ordered by encrinologist- make sure they addressed resukts with patient ------

## 2013-07-18 IMAGING — US US THYROID BIOPSY
1 series · 9 of 9 positions shown · non-contrast
Comparison: Ultrasound dated 05/02/2011

CLINICAL DATA: Dominant left thyroid nodule.

ULTRASOUND GUIDED NEEDLE ASPIRATE BIOPSY OF THE THYROID GLAND

[Series 1: us thyroid biopsy · 0.08mm/px · 9 acquisitions, 9 frames shown]
[im 1/9]
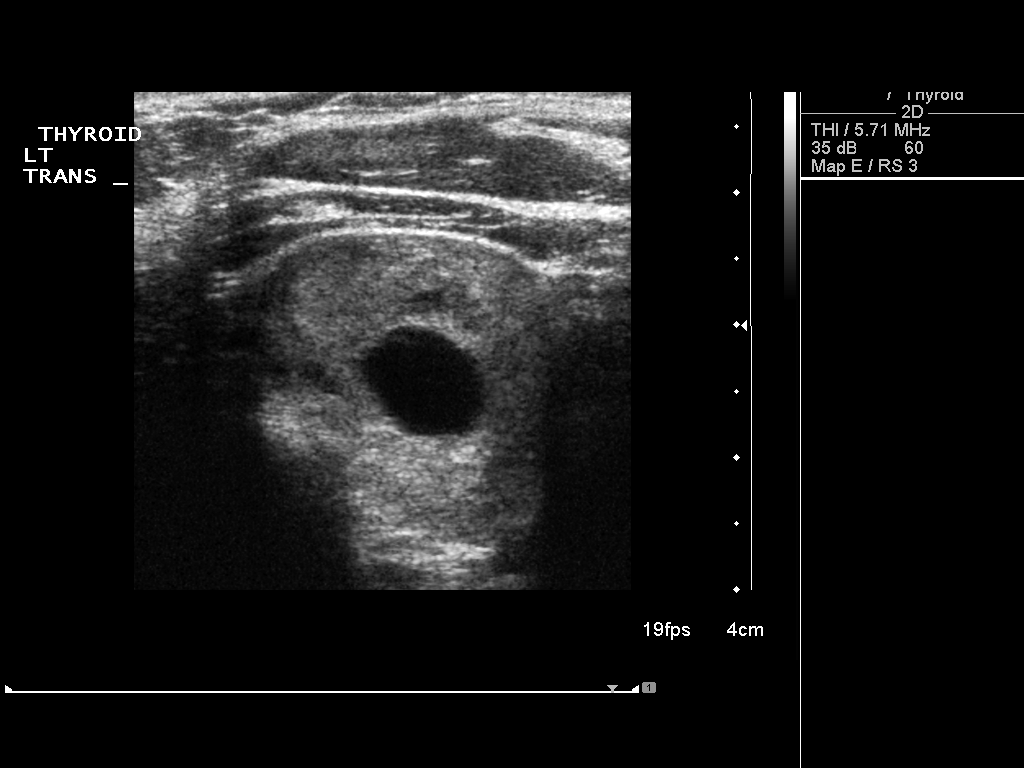
[im 2/9]
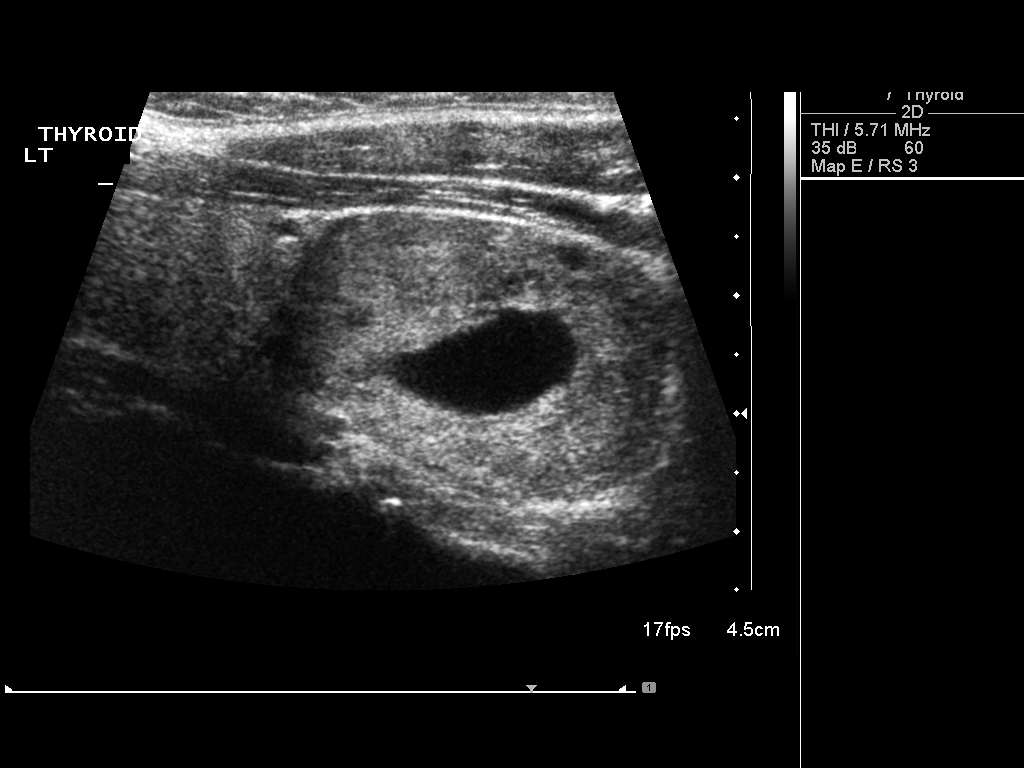
[im 3/9]
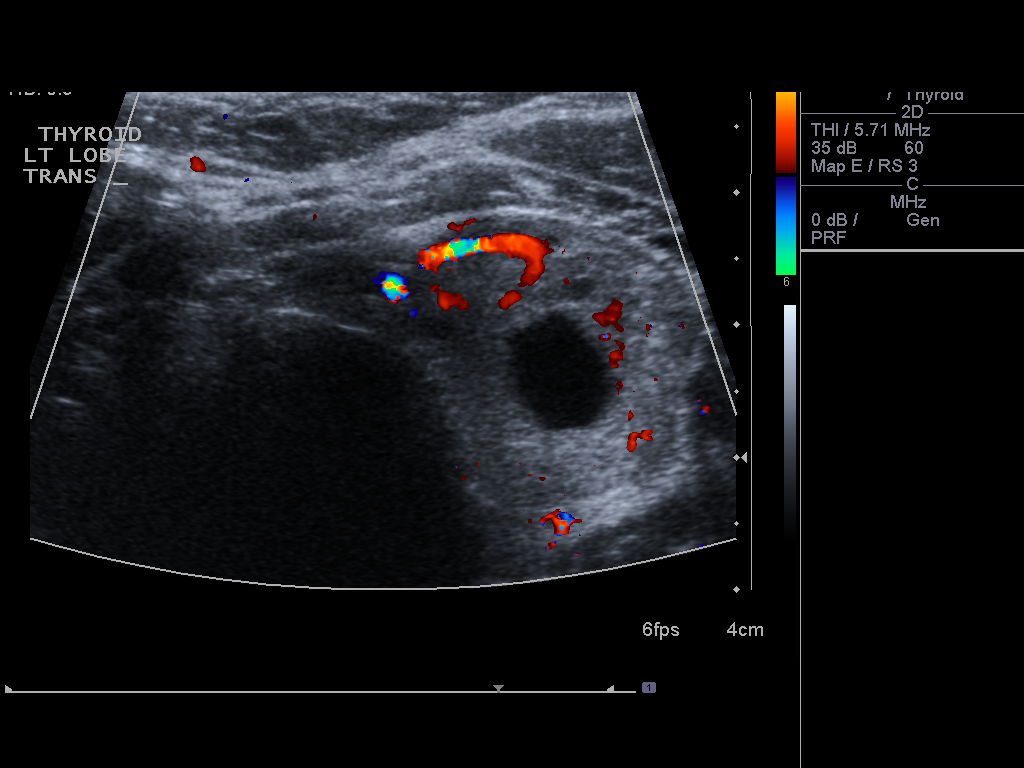
[im 4/9]
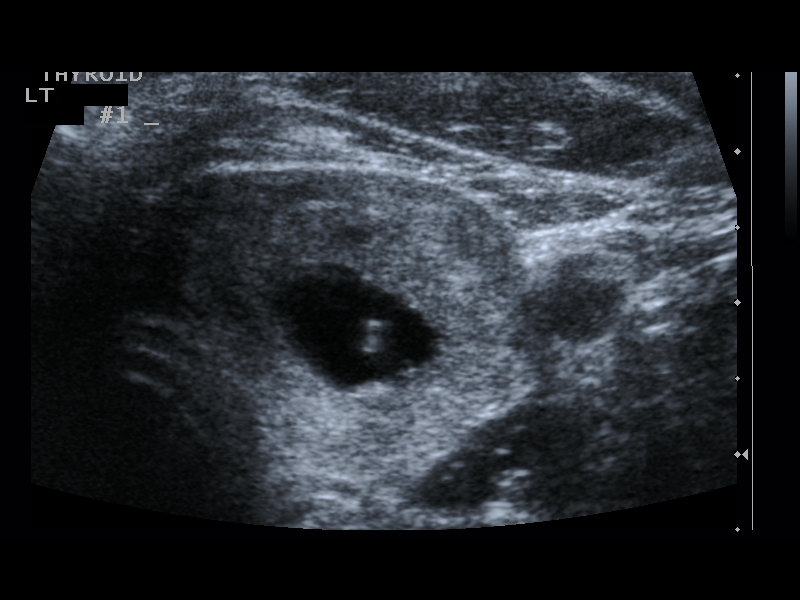
[im 5/9]
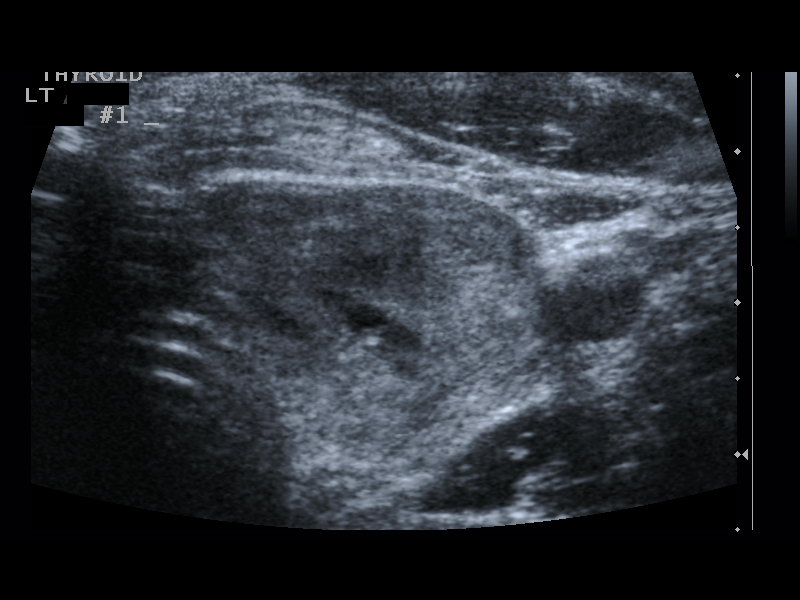
[im 6/9]
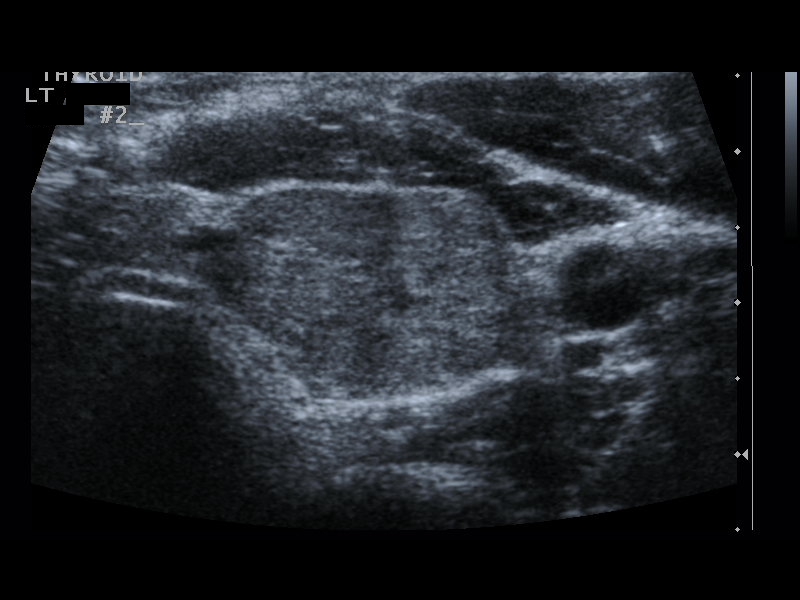
[im 7/9]
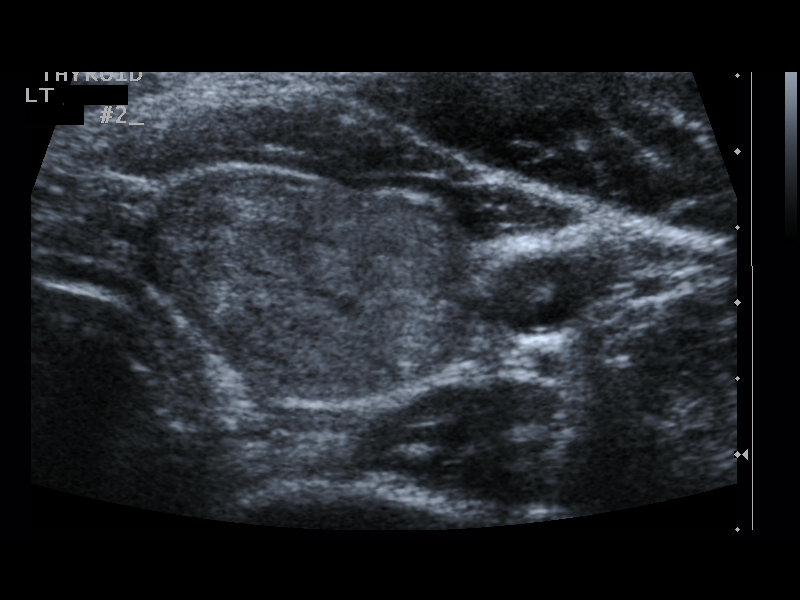
[im 8/9]
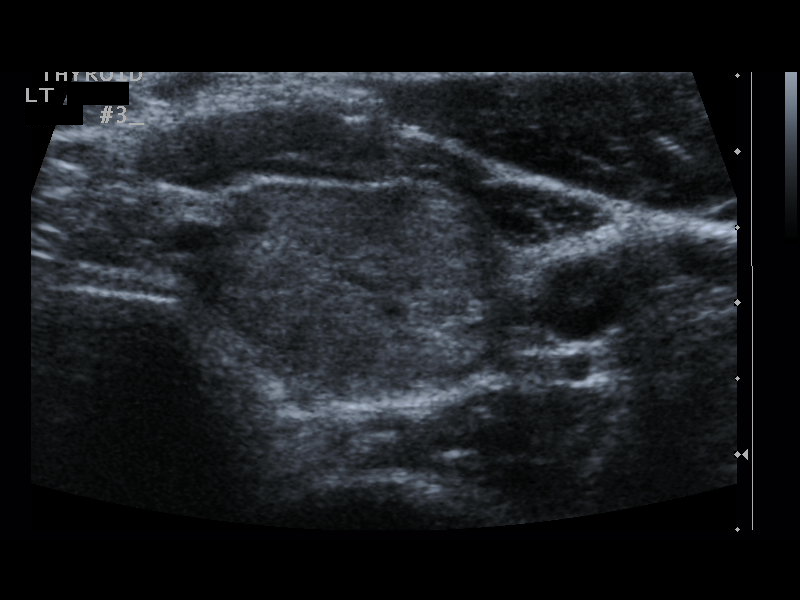
[im 9/9]
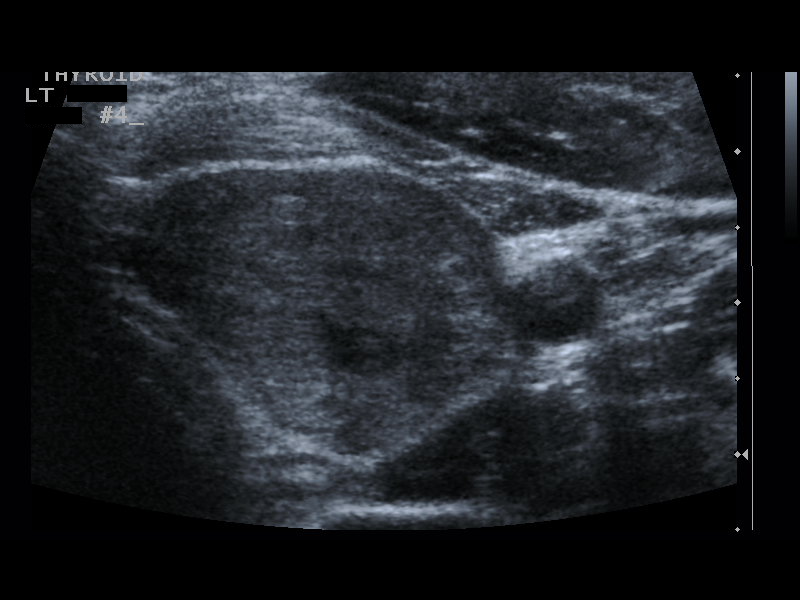

[9 of 9 positions shown; findings below may reference images not displayed]

Thyroid biopsy was thoroughly discussed with the patient and
questions were answered.  The benefits, risks, alternatives, and
complications were also discussed.  The patient understands and
wishes to proceed with the procedure.  Written consent was
obtained.

Ultrasound was performed to localize and mark an adequate site for
the biopsy.  The patient was then prepped and draped in a normal
sterile fashion.  Local anesthesia was provided with 1% lidocaine.
Using direct ultrasound guidance, 4 passes were made using 25 gauge
needles into the nodule within the left lobe of the thyroid.
Ultrasound was used to confirm needle placements on all occasions.
Specimens were sent to Pathology for analysis.

Complications:  None
FINDINGS: Ultrasound again demonstrates the predominately solid
nodule in the lower left lobe containing central cystic area.  This
nodule measures approximately 3.3 cm in greatest diameter.  Initial
aspiration of approximately 2 ml of slightly blood tinged fluid was
performed from the central cystic cavity.  This resulted [REDACTED]ompression of the cystic portion.  Additional needle aspirate
samples were obtained at the level of the peripheral solid
component of the nodule.
IMPRESSION: Ultrasound guided needle aspirate biopsy performed of the left
thyroid nodule.

## 2013-07-27 ENCOUNTER — Telehealth: Payer: Self-pay | Admitting: Nurse Practitioner

## 2013-07-27 ENCOUNTER — Ambulatory Visit: Payer: BC Managed Care – PPO | Admitting: Nurse Practitioner

## 2013-07-27 NOTE — Telephone Encounter (Signed)
appt give for 4/13 with [facility]

## 2013-08-03 ENCOUNTER — Encounter: Payer: Self-pay | Admitting: Nurse Practitioner

## 2013-08-03 ENCOUNTER — Ambulatory Visit (INDEPENDENT_AMBULATORY_CARE_PROVIDER_SITE_OTHER): Payer: BC Managed Care – PPO | Admitting: Nurse Practitioner

## 2013-08-03 VITALS — BP 121/75 | HR 71 | Temp 98.9°F | Ht 66.0 in | Wt 200.0 lb

## 2013-08-03 DIAGNOSIS — F411 Generalized anxiety disorder: Secondary | ICD-10-CM | POA: Insufficient documentation

## 2013-08-03 DIAGNOSIS — F909 Attention-deficit hyperactivity disorder, unspecified type: Secondary | ICD-10-CM

## 2013-08-03 DIAGNOSIS — F902 Attention-deficit hyperactivity disorder, combined type: Secondary | ICD-10-CM | POA: Insufficient documentation

## 2013-08-03 MED ORDER — LISDEXAMFETAMINE DIMESYLATE 60 MG PO CAPS: 60.0000 mg | ORAL_CAPSULE | ORAL | Status: DC

## 2013-08-03 NOTE — Patient Instructions (Signed)

## 2013-08-03 NOTE — Progress Notes (Signed)
   Subjective:    Patient ID: Jordan [name], female    DOB: [birth year], 43 y.o.   MRN: [id number]  HPI  PAtient in today for follow up of ADHD- SHe is currently doing well- takes meds daily- really helps keep her calm and concentrate  at work- Currently on vyvnase 50 mg and says not working as well as it use to - would like to increase dose and see how she dose.    Review of Systems  Constitutional: Negative.   HENT: Negative.   Respiratory: Negative.   Cardiovascular: Negative.   All other systems reviewed and are negative.      Objective:   Physical Exam  Constitutional: She is oriented to person, place, and time. She appears well-developed and well-nourished.  Cardiovascular: Normal rate, regular rhythm and normal heart sounds.   Pulmonary/Chest: Effort normal and breath sounds normal.  Neurological: She is alert and oriented to person, place, and time.  Skin: Skin is warm and dry.  Psychiatric: She has a normal mood and affect. Her behavior is normal. Judgment and thought content normal.     BP 121/75  Pulse 71  Temp(Src) 98.9 F (37.2 C) (Oral)  Ht 5\' 6"  (1.676 m)  Wt 200 lb (90.719 kg)  BMI 32.30 kg/m2      Assessment & Plan:   1. ADHD (attention deficit hyperactivity disorder), combined type    Meds ordered this encounter  Medications  . Thyroid (NATURE-THROID) 81.25 MG TABS    Sig: Take 81.25 mcg by mouth 2 (two) times daily.  Marland Kitchen buPROPion (WELLBUTRIN SR) 150 MG 12 hr tablet    Sig:   . ALPRAZolam (XANAX) 0.25 MG tablet    Sig:   . SF 5000 PLUS 1.1 % CREA dental cream    Sig:   . lisdexamfetamine (VYVANSE) 60 MG capsule    Sig: Take 1 capsule (60 mg total) by mouth every morning.    Dispense:  30 capsule    Refill:  0    Order Specific Question:  Supervising Provider    Answer:  Chipper Herb [[id number]]  . lisdexamfetamine (VYVANSE) 60 MG capsule    Sig: Take 1 capsule (60 mg total) by mouth every morning.    Dispense:  30 capsule    Refill:  0   DO NOT FILL TILL 09/02/13    Order Specific Question:  Supervising Provider    Answer:  Chipper Herb [[id number]]  increased vyvanse from 50mg  to 60 mg daily Meds as prescribed Behavior modification as needed Follow-up for recheck in 2 months  Ehrhardt, FNP

## 2013-09-28 ENCOUNTER — Telehealth: Payer: Self-pay | Admitting: Nurse Practitioner

## 2013-09-28 NOTE — Telephone Encounter (Signed)
Pharmacy will not send requeat for ADHD meds- patient has to call themselves to requst- we got no call- will refill when I return.

## 2013-09-29 NOTE — Telephone Encounter (Signed)
Patient states that she is out of med's and that you refilled for her before while mmm was out she wanted to know if you would refill this time while she is out.

## 2013-09-30 ENCOUNTER — Other Ambulatory Visit: Payer: Self-pay | Admitting: Family Medicine

## 2013-09-30 MED ORDER — LISDEXAMFETAMINE DIMESYLATE 60 MG PO CAPS: 60.0000 mg | ORAL_CAPSULE | ORAL | Status: DC

## 2013-10-06 ENCOUNTER — Telehealth: Payer: Self-pay | Admitting: Nurse Practitioner

## 2013-10-06 NOTE — Telephone Encounter (Signed)
Pt fell yesterday and has pain in both thumbs Denies pain in the wrists or swelling Offered appt but pt wants to wait to see how she feels tomorrow Will call to be seen

## 2013-10-15 ENCOUNTER — Telehealth: Payer: Self-pay | Admitting: Nurse Practitioner

## 2013-10-15 MED ORDER — LISDEXAMFETAMINE DIMESYLATE 60 MG PO CAPS: 60.0000 mg | ORAL_CAPSULE | ORAL | Status: DC

## 2013-10-15 NOTE — Telephone Encounter (Signed)
done

## 2013-10-15 NOTE — Telephone Encounter (Signed)
rx ready for pick up- the government will not allow you to get filled early so will have to get filled while out of town.

## 2013-10-15 NOTE — Telephone Encounter (Signed)
Script ready to be picked up at front desk.  

## 2013-11-16 ENCOUNTER — Telehealth: Payer: Self-pay | Admitting: Family Medicine

## 2013-11-16 ENCOUNTER — Encounter: Payer: Self-pay | Admitting: Family Medicine

## 2013-11-16 ENCOUNTER — Ambulatory Visit (INDEPENDENT_AMBULATORY_CARE_PROVIDER_SITE_OTHER): Payer: BC Managed Care – PPO | Admitting: Family Medicine

## 2013-11-16 VITALS — BP 121/83 | HR 93 | Temp 98.0°F | Ht 66.0 in | Wt 201.0 lb

## 2013-11-16 DIAGNOSIS — J0120 Acute ethmoidal sinusitis, unspecified: Secondary | ICD-10-CM

## 2013-11-16 MED ORDER — AMOXICILLIN 875 MG PO TABS: 875.0000 mg | ORAL_TABLET | Freq: Two times a day (BID) | ORAL | Status: DC

## 2013-11-16 MED ORDER — METHYLPREDNISOLONE ACETATE 80 MG/ML IJ SUSP
80.0000 mg | Freq: Once | INTRAMUSCULAR | Status: AC
  Administered 2013-11-16: 80 mg via INTRAMUSCULAR

## 2013-11-16 MED ORDER — FLUCONAZOLE 150 MG PO TABS: 150.0000 mg | ORAL_TABLET | Freq: Once | ORAL | Status: DC

## 2013-11-16 NOTE — Telephone Encounter (Signed)
appt given for today per patients request

## 2013-11-16 NOTE — Progress Notes (Signed)
   Subjective:    Patient ID: Jordan [name], female    DOB: 1971/04/20, 43 y.o.   MRN: [id number]  HPI  This 43 y.o. female presents for evaluation of facial discomfort and pressure.  Review of Systems No chest pain, SOB, HA, dizziness, vision change, N/V, diarrhea, constipation, dysuria, urinary urgency or frequency, myalgias, arthralgias or rash.     Objective:   Physical Exam  Vital signs noted  Well developed well nourished female.  HEENT - Head atraumatic Normocephalic                Eyes - PERRLA, Conjuctiva - clear Sclera- Clear EOMI                Ears - EAC's Wnl TM's Wnl Gross Hearing WNL                Throat - oropharanx wnl Respiratory - Lungs CTA bilateral Cardiac - RRR S1 and S2 without murmur GI - Abdomen soft Nontender and bowel sounds active x 4 Extremities - No edema. Neuro - Grossly intact.      Assessment & Plan:  Acute ethmoidal sinusitis, recurrence not specified - Plan: amoxicillin (AMOXIL) 875 MG tablet, methylPREDNISolone acetate (DEPO-MEDROL) injection 80 mg, fluconazole (DIFLUCAN) 150 MG tablet  Lysbeth Penner FNP

## 2013-12-01 ENCOUNTER — Telehealth: Payer: Self-pay | Admitting: Nurse Practitioner

## 2013-12-01 MED ORDER — LISDEXAMFETAMINE DIMESYLATE 60 MG PO CAPS: 60.0000 mg | ORAL_CAPSULE | ORAL | Status: DC

## 2013-12-01 NOTE — Telephone Encounter (Signed)
rx ready for pickup 

## 2013-12-02 NOTE — Telephone Encounter (Signed)
Patient aware rx is ready to be picked up and that she will need to be seen for any further refills

## 2013-12-24 ENCOUNTER — Telehealth: Payer: Self-pay | Admitting: Family Medicine

## 2013-12-24 NOTE — Telephone Encounter (Signed)
Discussed with patient and transferred to billing to discuss how to take care of balance. Once the hold is released we can schedule an appt.

## 2013-12-24 NOTE — Telephone Encounter (Signed)
According to our billing department patient owes a current balance of $217.03 that will have to be paid before appt can be scheduled.

## 2013-12-29 ENCOUNTER — Encounter: Payer: Self-pay | Admitting: Nurse Practitioner

## 2013-12-29 ENCOUNTER — Ambulatory Visit (INDEPENDENT_AMBULATORY_CARE_PROVIDER_SITE_OTHER): Payer: BC Managed Care – PPO | Admitting: Nurse Practitioner

## 2013-12-29 VITALS — BP 130/81 | HR 102 | Temp 97.7°F | Ht 66.0 in | Wt 200.2 lb

## 2013-12-29 DIAGNOSIS — F909 Attention-deficit hyperactivity disorder, unspecified type: Secondary | ICD-10-CM

## 2013-12-29 DIAGNOSIS — Z6832 Body mass index (BMI) 32.0-32.9, adult: Secondary | ICD-10-CM

## 2013-12-29 DIAGNOSIS — F902 Attention-deficit hyperactivity disorder, combined type: Secondary | ICD-10-CM

## 2013-12-29 DIAGNOSIS — Z713 Dietary counseling and surveillance: Secondary | ICD-10-CM

## 2013-12-29 MED ORDER — LISDEXAMFETAMINE DIMESYLATE 60 MG PO CAPS: 60.0000 mg | ORAL_CAPSULE | ORAL | Status: DC

## 2013-12-29 NOTE — Patient Instructions (Signed)
Exercise to Lose Weight Exercise and a healthy diet may help you lose weight. Your doctor may suggest specific exercises. EXERCISE IDEAS AND TIPS  Choose low-cost things you enjoy doing, such as walking, bicycling, or exercising to workout videos.  Take stairs instead of the elevator.  Walk during your lunch break.  Park your car further away from work or school.  Go to a gym or an exercise class.  Start with 5 to 10 minutes of exercise each day. Build up to 30 minutes of exercise 4 to 6 days a week.  Wear shoes with good support and comfortable clothes.  Stretch before and after working out.  Work out until you breathe harder and your heart beats faster.  Drink extra water when you exercise.  Do not do so much that you hurt yourself, feel dizzy, or get very short of breath. Exercises that burn about 150 calories:  Running 1  miles in 15 minutes.  Playing volleyball for 45 to 60 minutes.  Washing and waxing a car for 45 to 60 minutes.  Playing touch football for 45 minutes.  Walking 1  miles in 35 minutes.  Pushing a stroller 1  miles in 30 minutes.  Playing basketball for 30 minutes.  Raking leaves for 30 minutes.  Bicycling 5 miles in 30 minutes.  Walking 2 miles in 30 minutes.  Dancing for 30 minutes.  Shoveling snow for 15 minutes.  Swimming laps for 20 minutes.  Walking up stairs for 15 minutes.  Bicycling 4 miles in 15 minutes.  Gardening for 30 to 45 minutes.  Jumping rope for 15 minutes.  Washing windows or floors for 45 to 60 minutes. Document Released: 05/12/2010 Document Revised: 07/02/2011 Document Reviewed: 05/12/2010 Exit[employer] Patient Information 2015 [employer]. This information is not intended to replace advice given to you by your health care provider. Make sure you discuss any questions you have with your health care provider.  

## 2013-12-29 NOTE — Progress Notes (Signed)
   Subjective:    Patient ID: Jordan [name], female    DOB: 08-18-1970, 43 y.o.   MRN: [id number]  HPI Patient in today for adhd follow up [facility] is currently on vyvanse 60 mg daily- No c/o side effects- SHe says that the medicine helps her to focus at work. Patient had GYN physical in June at [place] office with blood work.    Review of Systems  Constitutional: Negative.   HENT: Negative.   Respiratory: Negative.   Cardiovascular: Negative.   Neurological: Negative.   Psychiatric/Behavioral: Negative.   All other systems reviewed and are negative.      Objective:   Physical Exam  Constitutional: She is oriented to person, place, and time. She appears well-developed and well-nourished.  Cardiovascular: Normal rate, regular rhythm and normal heart sounds.   Pulmonary/Chest: Effort normal and breath sounds normal.  Neurological: She is alert and oriented to person, place, and time.  Skin: Skin is warm and dry.  Psychiatric: She has a normal mood and affect. Her behavior is normal. Judgment and thought content normal.   BP 130/81  Pulse 102  Temp(Src) 97.7 F (36.5 C) (Oral)  Ht 5\' 6"  (1.676 m)  Wt 200 lb 3.2 oz (90.81 kg)  BMI 32.33 kg/m2        Assessment & Plan:   1. ADHD (attention deficit hyperactivity disorder), combined type   2. BMI 32.0-32.9,adult   3. Weight loss counseling, encounter for    Meds ordered this encounter  Medications  . lisdexamfetamine (VYVANSE) 60 MG capsule    Sig: Take 1 capsule (60 mg total) by mouth every morning.    Dispense:  30 capsule    Refill:  0    DO NOT FILL TILL 01/27/14    Order Specific Question:  Supervising Provider    Answer:  Chipper Herb [[id number]]  . lisdexamfetamine (VYVANSE) 60 MG capsule    Sig: Take 1 capsule (60 mg total) by mouth every morning.    Dispense:  30 capsule    Refill:  0    Order Specific Question:  Supervising Provider    Answer:  Chipper Herb [[id number]]   Stress management Weight loss discussed-  will recheck at next appointment  [place], FNP

## 2014-02-08 ENCOUNTER — Other Ambulatory Visit: Payer: Self-pay | Admitting: Nurse Practitioner

## 2014-02-10 ENCOUNTER — Encounter (HOSPITAL_BASED_OUTPATIENT_CLINIC_OR_DEPARTMENT_OTHER): Payer: Self-pay | Admitting: Emergency Medicine

## 2014-02-10 ENCOUNTER — Emergency Department (HOSPITAL_BASED_OUTPATIENT_CLINIC_OR_DEPARTMENT_OTHER): Payer: BC Managed Care – PPO

## 2014-02-10 ENCOUNTER — Emergency Department (HOSPITAL_BASED_OUTPATIENT_CLINIC_OR_DEPARTMENT_OTHER)
Admission: EM | Admit: 2014-02-10 | Discharge: 2014-02-10 | Disposition: A | Discharge status: Home / Self Care | Payer: BC Managed Care – PPO | Attending: Emergency Medicine | Admitting: Emergency Medicine

## 2014-02-10 DIAGNOSIS — E041 Nontoxic single thyroid nodule: Secondary | ICD-10-CM | POA: Insufficient documentation

## 2014-02-10 DIAGNOSIS — Z8742 Personal history of other diseases of the female genital tract: Secondary | ICD-10-CM | POA: Insufficient documentation

## 2014-02-10 DIAGNOSIS — G43909 Migraine, unspecified, not intractable, without status migrainosus: Secondary | ICD-10-CM | POA: Diagnosis not present

## 2014-02-10 DIAGNOSIS — Z720 Tobacco use: Secondary | ICD-10-CM | POA: Insufficient documentation

## 2014-02-10 DIAGNOSIS — Z87828 Personal history of other (healed) physical injury and trauma: Secondary | ICD-10-CM | POA: Insufficient documentation

## 2014-02-10 DIAGNOSIS — F909 Attention-deficit hyperactivity disorder, unspecified type: Secondary | ICD-10-CM | POA: Insufficient documentation

## 2014-02-10 DIAGNOSIS — R2241 Localized swelling, mass and lump, right lower limb: Secondary | ICD-10-CM | POA: Diagnosis present

## 2014-02-10 DIAGNOSIS — R1030 Lower abdominal pain, unspecified: Secondary | ICD-10-CM | POA: Diagnosis not present

## 2014-02-10 DIAGNOSIS — Z79899 Other long term (current) drug therapy: Secondary | ICD-10-CM | POA: Insufficient documentation

## 2014-02-10 DIAGNOSIS — M79604 Pain in right leg: Secondary | ICD-10-CM

## 2014-02-10 MED ORDER — HYDROCODONE-ACETAMINOPHEN 5-325 MG PO TABS
1.0000 | ORAL_TABLET | Freq: Four times a day (QID) | ORAL | Status: DC | PRN
Start: 2014-02-10 — End: 2015-03-21

## 2014-02-10 MED ORDER — IBUPROFEN 600 MG PO TABS: 600.0000 mg | ORAL_TABLET | Freq: Four times a day (QID) | ORAL | Status: DC | PRN

## 2014-02-10 NOTE — ED Notes (Signed)
C/o swelling rt leg since 2 days ago and pain started today. Swelling is from groin down and has pain in right groin that radiates down inner leg and calf.
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
## 2014-02-10 NOTE — Discharge Instructions (Signed)
You were evaluated today for leg pain. There is no obvious injury. Your ultrasound was negative for o'clock. At this time the cause of your pain is unknown. He'll be given pain medications at home. She should monitor for skin changes or any new or worsening symptoms. Followup with her primary Dr.

## 2014-02-26 ENCOUNTER — Other Ambulatory Visit: Payer: Self-pay | Admitting: Nurse Practitioner

## 2014-02-26 MED ORDER — LISDEXAMFETAMINE DIMESYLATE 60 MG PO CAPS: 60.0000 mg | ORAL_CAPSULE | ORAL | Status: DC

## 2014-02-26 NOTE — Telephone Encounter (Signed)
Pt aware.

## 2014-02-26 NOTE — Telephone Encounter (Signed)
vyvanse rx ready for pick up  

## 2014-03-25 ENCOUNTER — Telehealth: Payer: Self-pay | Admitting: Family Medicine

## 2014-03-26 NOTE — Telephone Encounter (Signed)
Pt needs refill on Vyvanse 60 mg, MMM is out of town and the pt runs out Monday.Please advise. It needs to be printed if ok to fill.

## 2014-03-27 ENCOUNTER — Other Ambulatory Visit: Payer: Self-pay | Admitting: Family Medicine

## 2014-03-27 MED ORDER — LISDEXAMFETAMINE DIMESYLATE 60 MG PO CAPS: 60.0000 mg | ORAL_CAPSULE | ORAL | Status: DC

## 2014-03-27 NOTE — Telephone Encounter (Signed)
Pt aware.

## 2014-03-27 NOTE — Telephone Encounter (Signed)
Come p/u rx

## 2014-04-21 ENCOUNTER — Other Ambulatory Visit: Payer: Self-pay | Admitting: Nurse Practitioner

## 2014-04-21 MED ORDER — LISDEXAMFETAMINE DIMESYLATE 60 MG PO CAPS: 60.0000 mg | ORAL_CAPSULE | ORAL | Status: DC

## 2014-04-21 NOTE — Telephone Encounter (Signed)
Pt aware.

## 2014-04-21 NOTE — Telephone Encounter (Signed)
vyvanse rx ready for pick up

## 2014-05-26 ENCOUNTER — Telehealth: Payer: Self-pay | Admitting: Nurse Practitioner

## 2014-05-26 MED ORDER — LISDEXAMFETAMINE DIMESYLATE 60 MG PO CAPS: 60.0000 mg | ORAL_CAPSULE | ORAL | Status: DC

## 2014-05-26 NOTE — Telephone Encounter (Signed)
Patient aware rx ready to be picked up 

## 2014-05-26 NOTE — Telephone Encounter (Signed)
vyvanse rx ready for pick up

## 2014-06-23 ENCOUNTER — Telehealth: Payer: Self-pay | Admitting: Nurse Practitioner

## 2014-06-23 MED ORDER — LISDEXAMFETAMINE DIMESYLATE 60 MG PO CAPS: 60.0000 mg | ORAL_CAPSULE | ORAL | Status: DC

## 2014-06-23 NOTE — Telephone Encounter (Signed)
vyvanse rx ready for pick up no more refills without being seen

## 2014-07-21 ENCOUNTER — Telehealth: Payer: Self-pay | Admitting: Nurse Practitioner

## 2014-07-21 NOTE — Telephone Encounter (Signed)
Patient was suppose to be told with last refill that no more refills without being seen

## 2014-07-22 ENCOUNTER — Other Ambulatory Visit: Payer: Self-pay | Admitting: Obstetrics and Gynecology

## 2014-07-23 ENCOUNTER — Encounter: Payer: Self-pay | Admitting: Nurse Practitioner

## 2014-07-23 ENCOUNTER — Ambulatory Visit (INDEPENDENT_AMBULATORY_CARE_PROVIDER_SITE_OTHER): Payer: BC Managed Care – PPO | Admitting: Nurse Practitioner

## 2014-07-23 VITALS — BP 139/86 | HR 71 | Temp 98.4°F | Ht 66.0 in | Wt 201.0 lb

## 2014-07-23 DIAGNOSIS — F902 Attention-deficit hyperactivity disorder, combined type: Secondary | ICD-10-CM

## 2014-07-23 LAB — CYTOLOGY - PAP

## 2014-07-23 MED ORDER — PHENTERMINE HCL 37.5 MG PO TABS: 37.5000 mg | ORAL_TABLET | Freq: Every day | ORAL | Status: DC

## 2014-07-23 MED ORDER — LISDEXAMFETAMINE DIMESYLATE 60 MG PO CAPS: 60.0000 mg | ORAL_CAPSULE | ORAL | Status: DC

## 2014-07-23 NOTE — Progress Notes (Signed)
   Subjective:    Patient ID: Jordan [name], female    DOB: 1971-01-10, 44 y.o.   MRN: [id number]  HPI Patient in today for ADHD follow up- SHe is currently on Vyvanse 60mg  daily- She is doing quite well. Sy=tays calmed and focused at worked. No complaint of side effects.    Review of Systems  Constitutional: Negative.   HENT: Negative.   Respiratory: Negative.   Cardiovascular: Negative.   Genitourinary: Negative.   Neurological: Negative.   Psychiatric/Behavioral: Negative.   All other systems reviewed and are negative.      Objective:   Physical Exam  Constitutional: She is oriented to person, place, and time. She appears well-developed and well-nourished.  HENT:  Head: Normocephalic.  Right Ear: External ear normal.  Left Ear: External ear normal.  Nose: Nose normal.  Mouth/Throat: Oropharynx is clear and moist.  Neck: Normal range of motion. Neck supple.  Cardiovascular: Normal rate, regular rhythm and normal heart sounds.   Pulmonary/Chest: Effort normal and breath sounds normal.  Neurological: She is alert and oriented to person, place, and time.  Skin: Skin is warm and dry.  Psychiatric: She has a normal mood and affect. Her behavior is normal. Judgment and thought content normal.   BP 139/86 mmHg  Pulse 71  Temp(Src) 98.4 F (36.9 C) (Oral)  Ht 5\' 6"  (1.676 m)  Wt 201 lb (91.173 kg)  BMI 32.46 kg/m2        Assessment & Plan:   1. ADHD (attention deficit hyperactivity disorder), combined type    Meds ordered this encounter  Medications  . lisdexamfetamine (VYVANSE) 60 MG capsule    Sig: Take 1 capsule (60 mg total) by mouth every morning.    Dispense:  30 capsule    Refill:  0    Order Specific Question:  Supervising Provider    Answer:  Chipper Herb [[id number]]  . lisdexamfetamine (VYVANSE) 60 MG capsule    Sig: Take 1 capsule (60 mg total) by mouth every morning.    Dispense:  30 capsule    Refill:  0    Do not fill till 08/22/14    Order  Specific Question:  Supervising Provider    Answer:  Chipper Herb [[id number]]  . lisdexamfetamine (VYVANSE) 60 MG capsule    Sig: Take 1 capsule (60 mg total) by mouth every morning.    Dispense:  30 capsule    Refill:  0    Do not fill till 09/21/14    Order Specific Question:  Supervising Provider    Answer:  Chipper Herb [[id number]]   Follow up in 3 months Stress management  [place], FNP

## 2014-07-29 ENCOUNTER — Other Ambulatory Visit: Payer: Self-pay | Admitting: Obstetrics and Gynecology

## 2014-07-29 DIAGNOSIS — Z803 Family history of malignant neoplasm of breast: Secondary | ICD-10-CM

## 2014-08-18 ENCOUNTER — Other Ambulatory Visit: Payer: BC Managed Care – PPO

## 2014-10-20 ENCOUNTER — Telehealth: Payer: Self-pay | Admitting: Nurse Practitioner

## 2014-10-20 NOTE — Telephone Encounter (Signed)
Please review and advise.

## 2014-10-21 MED ORDER — LISDEXAMFETAMINE DIMESYLATE 60 MG PO CAPS: 60.0000 mg | ORAL_CAPSULE | ORAL | Status: DC

## 2014-10-21 NOTE — Telephone Encounter (Signed)
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

## 2014-10-22 NOTE — Telephone Encounter (Signed)
Patient aware, script ready and our office will work on the prior approval.

## 2014-11-01 ENCOUNTER — Telehealth: Payer: Self-pay

## 2014-11-01 NOTE — Telephone Encounter (Signed)
Insurance prior authorized Vyvanse [id number] 10/07/14 through 10/29/15

## 2014-11-18 ENCOUNTER — Telehealth: Payer: Self-pay | Admitting: Nurse Practitioner

## 2014-11-18 MED ORDER — LISDEXAMFETAMINE DIMESYLATE 60 MG PO CAPS: 60.0000 mg | ORAL_CAPSULE | ORAL | Status: DC

## 2014-11-18 NOTE — Telephone Encounter (Signed)
Pt aware prescription is ready for pick up.

## 2014-11-18 NOTE — Telephone Encounter (Signed)
vyvanse rx ready for pick up

## 2014-12-20 ENCOUNTER — Telehealth: Payer: Self-pay | Admitting: Nurse Practitioner

## 2014-12-20 MED ORDER — LISDEXAMFETAMINE DIMESYLATE 60 MG PO CAPS: 60.0000 mg | ORAL_CAPSULE | ORAL | Status: DC

## 2014-12-20 NOTE — Telephone Encounter (Signed)
Patient aware that rx is ready to be picked up.  

## 2014-12-20 NOTE — Telephone Encounter (Signed)
vyvanse rx ready for pick up

## 2014-12-29 ENCOUNTER — Other Ambulatory Visit: Payer: Self-pay | Admitting: Obstetrics and Gynecology

## 2014-12-29 DIAGNOSIS — N6320 Unspecified lump in the left breast, unspecified quadrant: Secondary | ICD-10-CM

## 2015-01-04 ENCOUNTER — Ambulatory Visit
Admission: RE | Admit: 2015-01-04 | Discharge: 2015-01-04 | Disposition: A | Discharge status: Home / Self Care | Payer: BC Managed Care – PPO | Source: Ambulatory Visit | Attending: Obstetrics and Gynecology | Admitting: Obstetrics and Gynecology

## 2015-01-04 DIAGNOSIS — N6320 Unspecified lump in the left breast, unspecified quadrant: Secondary | ICD-10-CM

## 2015-01-14 ENCOUNTER — Telehealth: Payer: Self-pay | Admitting: Nurse Practitioner

## 2015-01-14 MED ORDER — LISDEXAMFETAMINE DIMESYLATE 60 MG PO CAPS: 60.0000 mg | ORAL_CAPSULE | ORAL | Status: DC

## 2015-01-14 NOTE — Telephone Encounter (Signed)
vyvanse rx ready for pick up  

## 2015-01-14 NOTE — Telephone Encounter (Signed)
Message left that script is ready for pickup at front desk with photo ID

## 2015-02-18 ENCOUNTER — Telehealth: Payer: Self-pay | Admitting: Nurse Practitioner

## 2015-02-18 MED ORDER — LISDEXAMFETAMINE DIMESYLATE 60 MG PO CAPS: 60.0000 mg | ORAL_CAPSULE | ORAL | Status: DC

## 2015-02-18 NOTE — Telephone Encounter (Signed)
Patient aware that refill is ready for pick up and will have to be seen for any further refills.

## 2015-02-18 NOTE — Telephone Encounter (Signed)
vyvanse rx ready for pick up- Last refill without being seen

## 2015-03-21 ENCOUNTER — Ambulatory Visit (INDEPENDENT_AMBULATORY_CARE_PROVIDER_SITE_OTHER): Payer: BC Managed Care – PPO | Admitting: Family

## 2015-03-21 ENCOUNTER — Encounter: Payer: Self-pay | Admitting: Family

## 2015-03-21 DIAGNOSIS — F902 Attention-deficit hyperactivity disorder, combined type: Secondary | ICD-10-CM

## 2015-03-21 MED ORDER — VYVANSE 60 MG PO CAPS: 60.0000 mg | ORAL_CAPSULE | ORAL | Status: DC

## 2015-03-21 MED ORDER — LISDEXAMFETAMINE DIMESYLATE 60 MG PO CAPS: 60.0000 mg | ORAL_CAPSULE | ORAL | Status: DC

## 2015-03-21 NOTE — Patient Instructions (Signed)

## 2015-03-21 NOTE — Progress Notes (Signed)
   Subjective:    Patient ID: Jordan [name], female    DOB: 1970-06-29, 44 y.o.   MRN: [id number]   HPI Pt presents to the office today for ADHD. Pt is usually seen by Shelah Lewandowsky FNP, but states she is "booked this week" and states she took her last Vyvanse this AM.SHe is currently on Vyvanse 60mg  daily. Pt states this is working well and is able to stay focused at worked. No complaint of side effects.   Review of Systems  Constitutional: Negative.   HENT: Negative.   Eyes: Negative.   Respiratory: Negative.  Negative for shortness of breath.   Cardiovascular: Negative.  Negative for palpitations.  Gastrointestinal: Negative.   Endocrine: Negative.   Genitourinary: Negative.   Musculoskeletal: Negative.   Neurological: Negative.  Negative for headaches.  Hematological: Negative.   Psychiatric/Behavioral: Negative.   All other systems reviewed and are negative.      Objective:   Physical Exam  Constitutional: She is oriented to person, place, and time. She appears well-developed and well-nourished. No distress.  HENT:  Head: Normocephalic and atraumatic.  Eyes: Pupils are equal, round, and reactive to light.  Neck: Normal range of motion. Neck supple. No thyromegaly present.  Cardiovascular: Normal rate, regular rhythm, normal heart sounds and intact distal pulses.   No murmur heard. Pulmonary/Chest: Effort normal and breath sounds normal. No respiratory distress. She has no wheezes.  Abdominal: Soft. Bowel sounds are normal. She exhibits no distension. There is no tenderness.  Musculoskeletal: Normal range of motion. She exhibits no edema or tenderness.  Neurological: She is alert and oriented to person, place, and time. She has normal reflexes. No cranial nerve deficit.  Skin: Skin is warm and dry.  Psychiatric: She has a normal mood and affect. Her behavior is normal. Judgment and thought content normal.  Vitals reviewed.   BP 130/89 mmHg  Pulse 82  Temp(Src) 97.1  F (36.2 C) (Oral)  Ht 5\' 6"  (1.676 m)  Wt 200 lb (90.719 kg)  BMI 32.30 kg/m2       Assessment & Plan:  1. ADHD (attention deficit hyperactivity disorder), combined type Meds as prescribed Behavior modification as needed Follow-up for recheck in 3 months - VYVANSE 60 MG capsule; Take 1 capsule (60 mg total) by mouth every morning.  Dispense: 30 capsule; Refill: 0 - lisdexamfetamine (VYVANSE) 60 MG capsule; Take 1 capsule (60 mg total) by mouth every morning.  Dispense: 30 capsule; Refill: 0  [name], FNP

## 2015-04-06 ENCOUNTER — Encounter: Payer: Self-pay | Admitting: Family Medicine

## 2015-04-06 ENCOUNTER — Ambulatory Visit (INDEPENDENT_AMBULATORY_CARE_PROVIDER_SITE_OTHER): Payer: BC Managed Care – PPO | Admitting: Family Medicine

## 2015-04-06 VITALS — BP 114/60 | HR 85 | Temp 98.4°F | Ht 66.0 in | Wt 202.3 lb

## 2015-04-06 DIAGNOSIS — J029 Acute pharyngitis, unspecified: Secondary | ICD-10-CM

## 2015-04-06 DIAGNOSIS — L209 Atopic dermatitis, unspecified: Secondary | ICD-10-CM

## 2015-04-06 DIAGNOSIS — J020 Streptococcal pharyngitis: Secondary | ICD-10-CM

## 2015-04-06 LAB — POCT RAPID STREP A (OFFICE): Rapid Strep A Screen: POSITIVE — AB

## 2015-04-06 MED ORDER — AMOXICILLIN 875 MG PO TABS: 875.0000 mg | ORAL_TABLET | Freq: Two times a day (BID) | ORAL | Status: DC

## 2015-04-06 NOTE — Progress Notes (Signed)
BP 114/60 mmHg  Pulse 85  Temp(Src) 98.4 F (36.9 C) (Oral)  Ht 5\' 6"  (1.676 m)  Wt 202 lb 4.8 oz (91.763 kg)  BMI 32.67 kg/m2   Subjective:    Patient ID: Jordan [name], female    DOB: 09/28/1970, 44 y.o.   MRN: [id number]  HPI: Jordan [name] is a 44 y.o. female presenting on 04/06/2015 for Sore throat, fever, white spots in throat and Itchy rash on back, buttocks and abdomen   HPI Sore throat and fever Patient presents today with the sore throat and fever and noticing white spots on the back of throat. This is been going on for at least 3-4 days. She just recently had a hip replacement surgery and left the hospital less than a week ago. She thinks she started to feel ill while she was in the hospital. She has small amount of cough but not much in the cough is nonproductive. She has some nasal drainage and congestion and postnasal drainage. She had a fever but she did not take the temperature and that was earlier this morning and last night.  Rash Patient has very pruritic rash on her abdomen and back bilaterally. She has a few small lesions scattered over those areas. She says the itching is the worst and even sometimes wakes her up at night. There is no drainage from any of the areas and there is no erythema or warmth.  Relevant past medical, surgical, family and social history reviewed and updated as indicated. Interim medical history since our last visit reviewed. Allergies and medications reviewed and updated.  Review of Systems  Constitutional: Positive for fever. Negative for chills.  HENT: Positive for congestion, postnasal drip, rhinorrhea, sinus pressure and sore throat. Negative for ear discharge, ear pain and sneezing.   Eyes: Negative for pain, redness and visual disturbance.  Respiratory: Positive for cough. Negative for chest tightness and shortness of breath.   Cardiovascular: Negative for chest pain and leg swelling.  Genitourinary: Negative for dysuria and  difficulty urinating.  Musculoskeletal: Negative for back pain and gait problem.  Skin: Positive for rash.  Neurological: Negative for light-headedness and headaches.  Psychiatric/Behavioral: Negative for behavioral problems and agitation.  All other systems reviewed and are negative.   Per HPI unless specifically indicated above     Medication List       This list is accurate as of: 04/06/15 12:31 PM.  Always use your most recent med list.               ALPRAZolam 0.5 MG tablet  Commonly known as:  XANAX     amoxicillin 875 MG tablet  Commonly known as:  AMOXIL  Take 1 tablet (875 mg total) by mouth 2 (two) times daily.     ARMOUR THYROID 60 MG tablet  Generic drug:  thyroid     ARMOUR THYROID 90 MG tablet  Generic drug:  thyroid     buPROPion 150 MG 12 hr tablet  Commonly known as:  WELLBUTRIN SR     ferrous fumarate 325 (106 FE) MG Tabs tablet  Commonly known as:  HEMOCYTE - 106 mg FE  Take 1 tablet by mouth.     gabapentin 100 MG capsule  Commonly known as:  NEURONTIN     levonorgestrel 20 MCG/24HR IUD  Commonly known as:  MIRENA  1 each by Intrauterine route once.     oxyCODONE 5 MG immediate release tablet  Commonly known as:  Oxy IR/ROXICODONE  VYVANSE 60 MG capsule  Generic drug:  lisdexamfetamine  Take 1 capsule (60 mg total) by mouth every morning.     lisdexamfetamine 60 MG capsule  Commonly known as:  VYVANSE  Take 1 capsule (60 mg total) by mouth every morning.           Objective:    BP 114/60 mmHg  Pulse 85  Temp(Src) 98.4 F (36.9 C) (Oral)  Ht 5\' 6"  (1.676 m)  Wt 202 lb 4.8 oz (91.763 kg)  BMI 32.67 kg/m2  Wt Readings from Last 3 Encounters:  04/06/15 202 lb 4.8 oz (91.763 kg)  03/21/15 200 lb (90.719 kg)  07/23/14 201 lb (91.173 kg)    Physical Exam  Constitutional: She is oriented to person, place, and time. She appears well-developed and well-nourished. No distress.  HENT:  Right Ear: Tympanic membrane, external  ear and ear canal normal.  Left Ear: Tympanic membrane, external ear and ear canal normal.  Nose: Mucosal edema and rhinorrhea present. No epistaxis. Right sinus exhibits no maxillary sinus tenderness and no frontal sinus tenderness. Left sinus exhibits no maxillary sinus tenderness and no frontal sinus tenderness.  Mouth/Throat: Uvula is midline and mucous membranes are normal. Oropharyngeal exudate, posterior oropharyngeal edema and posterior oropharyngeal erythema present. No tonsillar abscesses.  Eyes: Conjunctivae and EOM are normal.  Neck: Neck supple. No thyromegaly present.  Cardiovascular: Normal rate, regular rhythm, normal heart sounds and intact distal pulses.   No murmur heard. Pulmonary/Chest: Effort normal and breath sounds normal. No respiratory distress. She has no wheezes.  Abdominal: Soft. Bowel sounds are normal. She exhibits no distension. There is no tenderness.  Musculoskeletal: Normal range of motion. She exhibits no edema or tenderness.  Lymphadenopathy:    She has no cervical adenopathy.  Neurological: She is alert and oriented to person, place, and time. Coordination normal.  Skin: Skin is warm and dry. Rash noted. Rash is papular (small fine papules scattered over her abdomen and back. Very pruritic. No drainage or erythema noted.). She is not diaphoretic.  Psychiatric: She has a normal mood and affect. Her behavior is normal.  Vitals reviewed.   Results for orders placed or performed in visit on 04/06/15  POCT rapid strep A  Result Value Ref Range   Rapid Strep A Screen Positive (A) Negative      Assessment & Plan:   Problem List Items Addressed This Visit    None    Visit Diagnoses    Sore throat    -  Primary    Relevant Medications    amoxicillin (AMOXIL) 875 MG tablet    Other Relevant Orders    POCT rapid strep A (Completed)    Atopic dermatitis        already using benadryl and hydrocortisone, will give prednisone if ok with surgeon, concern for  atopic dermatitis versus drug reaction, only new drug- Pain meds    Strep pharyngitis        Relevant Medications    amoxicillin (AMOXIL) 875 MG tablet        Follow up plan: Return if symptoms worsen or fail to improve.  Counseling provided for all of the vaccine components Orders Placed This Encounter  Procedures  . POCT rapid strep A    Caryl Pina, MD [facility] 04/06/2015, 12:32 PM

## 2015-04-26 ENCOUNTER — Ambulatory Visit: Payer: BC Managed Care – PPO | Attending: Orthopaedic Surgery | Admitting: Physical Therapy

## 2015-04-26 DIAGNOSIS — R29898 Other symptoms and signs involving the musculoskeletal system: Secondary | ICD-10-CM | POA: Diagnosis present

## 2015-04-26 DIAGNOSIS — R269 Unspecified abnormalities of gait and mobility: Secondary | ICD-10-CM | POA: Diagnosis present

## 2015-04-26 DIAGNOSIS — M25551 Pain in right hip: Secondary | ICD-10-CM
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
## 2015-04-28 ENCOUNTER — Ambulatory Visit: Payer: BC Managed Care – PPO | Admitting: Physical Therapy

## 2015-04-28 DIAGNOSIS — R29898 Other symptoms and signs involving the musculoskeletal system: Secondary | ICD-10-CM

## 2015-04-28 DIAGNOSIS — M25551 Pain in right hip: Secondary | ICD-10-CM | POA: Diagnosis not present

## 2015-04-28 DIAGNOSIS — R269 Unspecified abnormalities of gait and mobility: Secondary | ICD-10-CM
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
## 2015-04-29 ENCOUNTER — Ambulatory Visit: Payer: BC Managed Care – PPO | Admitting: Physical Therapy

## 2015-04-29 DIAGNOSIS — R29898 Other symptoms and signs involving the musculoskeletal system: Secondary | ICD-10-CM

## 2015-04-29 DIAGNOSIS — M25551 Pain in right hip: Secondary | ICD-10-CM | POA: Diagnosis not present

## 2015-04-29 DIAGNOSIS — R269 Unspecified abnormalities of gait and mobility: Secondary | ICD-10-CM
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
## 2015-05-03 ENCOUNTER — Encounter: Payer: Self-pay | Admitting: Physical Therapy

## 2015-05-03 ENCOUNTER — Ambulatory Visit: Payer: BC Managed Care – PPO | Admitting: Physical Therapy

## 2015-05-03 DIAGNOSIS — R29898 Other symptoms and signs involving the musculoskeletal system: Secondary | ICD-10-CM

## 2015-05-03 DIAGNOSIS — R269 Unspecified abnormalities of gait and mobility: Secondary | ICD-10-CM

## 2015-05-03 DIAGNOSIS — M25551 Pain in right hip: Secondary | ICD-10-CM
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
## 2015-05-06 ENCOUNTER — Encounter: Payer: BC Managed Care – PPO | Admitting: Physical Therapy

## 2015-05-09 ENCOUNTER — Ambulatory Visit: Payer: BC Managed Care – PPO | Admitting: Physical Therapy

## 2015-05-09 DIAGNOSIS — R29898 Other symptoms and signs involving the musculoskeletal system: Secondary | ICD-10-CM

## 2015-05-09 DIAGNOSIS — R269 Unspecified abnormalities of gait and mobility: Secondary | ICD-10-CM

## 2015-05-09 DIAGNOSIS — M25551 Pain in right hip: Secondary | ICD-10-CM | POA: Diagnosis not present
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
## 2015-05-12 ENCOUNTER — Ambulatory Visit: Payer: BC Managed Care – PPO | Admitting: Physical Therapy

## 2015-06-07 ENCOUNTER — Other Ambulatory Visit: Payer: Self-pay | Admitting: Family

## 2015-06-07 DIAGNOSIS — F902 Attention-deficit hyperactivity disorder, combined type: Secondary | ICD-10-CM

## 2015-06-07 MED ORDER — LISDEXAMFETAMINE DIMESYLATE 60 MG PO CAPS: 60.0000 mg | ORAL_CAPSULE | ORAL | Status: DC

## 2015-06-07 NOTE — Telephone Encounter (Signed)
vyvanse ready to pick up Please let her know new policy and that she will need to be seen in future to get refills.

## 2015-06-07 NOTE — Telephone Encounter (Signed)
Left detailed message stating rx ready for pick up and new policy in office will ntbs every 3 months to get refills, to CB with any questions or concerns.

## 2015-07-07 ENCOUNTER — Telehealth: Payer: Self-pay | Admitting: Family

## 2015-07-08 ENCOUNTER — Other Ambulatory Visit: Payer: Self-pay

## 2015-07-08 DIAGNOSIS — F902 Attention-deficit hyperactivity disorder, combined type: Secondary | ICD-10-CM

## 2015-07-08 NOTE — Telephone Encounter (Signed)
Last seen 04/06/15  Dr Dettinger   [facility]   If approved print

## 2015-07-08 NOTE — Telephone Encounter (Signed)
This refill request for Vyvanse needs to go through [facility], please forward to her when she is back tomorrow.

## 2015-07-08 NOTE — Telephone Encounter (Signed)
Sent RX to Dr Warrick Parisian

## 2015-07-11 NOTE — Telephone Encounter (Signed)
PT needs follow up appt

## 2015-07-12 ENCOUNTER — Other Ambulatory Visit: Payer: Self-pay | Admitting: Family

## 2015-07-12 DIAGNOSIS — F902 Attention-deficit hyperactivity disorder, combined type: Secondary | ICD-10-CM

## 2015-07-12 MED ORDER — LISDEXAMFETAMINE DIMESYLATE 60 MG PO CAPS: 60.0000 mg | ORAL_CAPSULE | ORAL | Status: DC

## 2015-07-12 NOTE — Telephone Encounter (Addendum)
Patient aware rx is ready to be picked up 

## 2015-07-12 NOTE — Telephone Encounter (Signed)
RX ready for pick up 

## 2015-07-14 ENCOUNTER — Encounter: Payer: Self-pay | Admitting: Family

## 2015-07-14 ENCOUNTER — Ambulatory Visit (INDEPENDENT_AMBULATORY_CARE_PROVIDER_SITE_OTHER): Payer: BC Managed Care – PPO | Admitting: Family

## 2015-07-14 VITALS — BP 130/83 | HR 94 | Temp 98.0°F | Ht 66.0 in | Wt 199.0 lb

## 2015-07-14 DIAGNOSIS — F902 Attention-deficit hyperactivity disorder, combined type: Secondary | ICD-10-CM

## 2015-07-14 DIAGNOSIS — J0100 Acute maxillary sinusitis, unspecified: Secondary | ICD-10-CM

## 2015-07-14 MED ORDER — AMOXICILLIN-POT CLAVULANATE 875-125 MG PO TABS: 1.0000 | ORAL_TABLET | Freq: Two times a day (BID) | ORAL | Status: DC

## 2015-07-14 MED ORDER — VYVANSE 60 MG PO CAPS: 60.0000 mg | ORAL_CAPSULE | ORAL | Status: DC

## 2015-07-14 MED ORDER — MOMETASONE FUROATE 50 MCG/ACT NA SUSP: 2.0000 | Freq: Every day | NASAL | Status: DC

## 2015-07-14 MED ORDER — LISDEXAMFETAMINE DIMESYLATE 60 MG PO CAPS: 60.0000 mg | ORAL_CAPSULE | ORAL | Status: DC

## 2015-07-14 NOTE — Patient Instructions (Signed)

## 2015-07-14 NOTE — Progress Notes (Signed)
Subjective:    Patient ID: Jordan [name], female    DOB: [birth year], 45 y.o.   MRN: [id number]  Pt presents to the office today for ADHD. SHe is currently on Vyvanse 60mg  daily. Pt states this is working well and is able to stay focused at worked. No complaint of side effects. Sinus Problem This is a new problem. The current episode started in the past 7 days. The problem has been gradually worsening since onset. There has been no fever. Her pain is at a severity of 7/10. The pain is moderate. Associated symptoms include congestion, headaches and sinus pressure. Pertinent negatives include no coughing, shortness of breath, sneezing or sore throat. Past treatments include lying down, oral decongestants and acetaminophen. The treatment provided mild relief.  .   Review of Systems  Constitutional: Negative.   HENT: Positive for congestion and sinus pressure. Negative for sneezing and sore throat.   Eyes: Negative.   Respiratory: Negative.  Negative for cough and shortness of breath.   Cardiovascular: Negative.  Negative for palpitations.  Gastrointestinal: Negative.   Endocrine: Negative.   Genitourinary: Negative.   Musculoskeletal: Negative.   Neurological: Positive for headaches.  Hematological: Negative.   Psychiatric/Behavioral: Negative.   All other systems reviewed and are negative.      Objective:   Physical Exam  Constitutional: She is oriented to person, place, and time. She appears well-developed and well-nourished. No distress.  HENT:  Head: Normocephalic and atraumatic.  Left Ear: There is tenderness. A middle ear effusion is present.  Nose: Left sinus exhibits maxillary sinus tenderness and frontal sinus tenderness.  Eyes: Pupils are equal, round, and reactive to light.  Neck: Normal range of motion. Neck supple. No thyromegaly present.  Cardiovascular: Normal rate, regular rhythm, normal heart sounds and intact distal pulses.   No murmur heard. Pulmonary/Chest:  Effort normal and breath sounds normal. No respiratory distress. She has no wheezes.  Abdominal: Soft. Bowel sounds are normal. She exhibits no distension. There is no tenderness.  Musculoskeletal: Normal range of motion. She exhibits no edema or tenderness.  Neurological: She is alert and oriented to person, place, and time.  Skin: Skin is warm and dry.  Psychiatric: She has a normal mood and affect. Her behavior is normal. Judgment and thought content normal.  Vitals reviewed.   BP 130/83 mmHg  Pulse 94  Temp(Src) 98 F (36.7 C) (Oral)  Ht 5\' 6"  (1.676 m)  Wt 199 lb (90.266 kg)  BMI 32.13 kg/m2       Assessment & Plan:  1. ADHD (attention deficit hyperactivity disorder), combined type -Meds as prescribed Behavior modification as needed -ADHD contract signed Follow-up for recheck in 3 months - VYVANSE 60 MG capsule; Take 1 capsule (60 mg total) by mouth every morning.  Dispense: 30 capsule; Refill: 0 - lisdexamfetamine (VYVANSE) 60 MG capsule; Take 1 capsule (60 mg total) by mouth every morning.  Dispense: 30 capsule; Refill: 0 - lisdexamfetamine (VYVANSE) 60 MG capsule; Take 1 capsule (60 mg total) by mouth every morning.  Dispense: 30 capsule; Refill: 0  2. Acute maxillary sinusitis, recurrence not specified -- Take meds as prescribed - Use a cool mist humidifier  -Use saline nose sprays frequently -Saline irrigations of the nose can be very helpful if done frequently.  * 4X daily for 1 week*  * Use of a nettie pot can be helpful with this. Follow directions with this* -Force fluids -For any cough or congestion  Use plain Mucinex- regular strength  or max strength is fine   * Children- consult with Pharmacist for dosing -For fever or aces or pains- take tylenol or ibuprofen appropriate for age and weight.  * for fevers greater than 101 orally you may alternate ibuprofen and tylenol every  3 hours. -Throat lozenges if help - amoxicillin-clavulanate (AUGMENTIN) 875-125 MG  tablet; Take 1 tablet by mouth 2 (two) times daily.  Dispense: 14 tablet; Refill: 0    [name], FNP

## 2015-07-14 NOTE — Addendum Note (Signed)
Addended by: [name] A on: 07/14/2015 12:37 PM   Modules accepted: Orders

## 2015-07-22 ENCOUNTER — Encounter: Payer: Self-pay | Admitting: Nurse Practitioner

## 2015-07-22 ENCOUNTER — Ambulatory Visit (INDEPENDENT_AMBULATORY_CARE_PROVIDER_SITE_OTHER): Payer: BC Managed Care – PPO | Admitting: Nurse Practitioner

## 2015-07-22 VITALS — BP 132/81 | HR 93 | Temp 98.1°F | Ht 66.0 in | Wt 204.0 lb

## 2015-07-22 DIAGNOSIS — F411 Generalized anxiety disorder: Secondary | ICD-10-CM

## 2015-07-22 DIAGNOSIS — R7989 Other specified abnormal findings of blood chemistry: Secondary | ICD-10-CM

## 2015-07-22 DIAGNOSIS — E041 Nontoxic single thyroid nodule: Secondary | ICD-10-CM | POA: Diagnosis not present

## 2015-07-22 DIAGNOSIS — R05 Cough: Secondary | ICD-10-CM

## 2015-07-22 DIAGNOSIS — R946 Abnormal results of thyroid function studies: Secondary | ICD-10-CM

## 2015-07-22 DIAGNOSIS — R059 Cough, unspecified: Secondary | ICD-10-CM

## 2015-07-22 MED ORDER — BENZONATATE 100 MG PO CAPS: 100.0000 mg | ORAL_CAPSULE | Freq: Three times a day (TID) | ORAL | Status: DC | PRN

## 2015-07-22 MED ORDER — ARMOUR THYROID 60 MG PO TABS: 60.0000 mg | ORAL_TABLET | Freq: Every day | ORAL | Status: DC

## 2015-07-22 MED ORDER — ARMOUR THYROID 90 MG PO TABS: 90.0000 mg | ORAL_TABLET | Freq: Every day | ORAL | Status: DC

## 2015-07-22 MED ORDER — BUPROPION HCL ER (SR) 150 MG PO TB12: 150.0000 mg | ORAL_TABLET | Freq: Every day | ORAL | Status: DC

## 2015-07-22 NOTE — Progress Notes (Signed)
  Subjective:     Jordan [name] is a 45 y.o. female who presents for evaluation of sinus pain. Symptoms include: cough, headaches and sore throat. Onset of symptoms was 1 day ago. Symptoms have been gradually worsening since that time. Past history is significant for no history of pneumonia or bronchitis. Patient is a smoker  (1/2 ppd x 15 yrs).  * Saw [name] for earinfection - was given augmentin and nasonex  Hypothyroidism- needs refill on meds GAD- needs wellbutrin refilled- meds working well- no side effects.  The following portions of the patient's history were reviewed and updated as appropriate: allergies, current medications, past family history, past medical history, past social history, past surgical history and problem list.  Review of Systems Pertinent items are noted in HPI.   Objective:    BP 132/81 mmHg  Pulse 93  Temp(Src) 98.1 F (36.7 C) (Oral)  Ht '5\' 6"'$  (1.676 m)  Wt 204 lb (92.534 kg)  BMI 32.94 kg/m2 General appearance: alert and cooperative Eyes: conjunctivae/corneas clear. PERRL, EOM's intact. Fundi benign. Ears: normal TM's and external ear canals both ears and clear effusion both ears Nose: mild congestion, turbinates red, no sinus tenderness Throat: lips, mucosa, and tongue normal; teeth and gums normal Neck: no adenopathy, no carotid bruit, no JVD, supple, symmetrical, trachea midline and thyroid not enlarged, symmetric, no tenderness/mass/nodules Lungs: clear to auscultation bilaterally and dry cough Heart: regular rate and rhythm, S1, S2 normal, no murmur, click, rub or gallop    Assessment:   see below   Plan:  1. Thyroid nodule, uninodular Labs pending - ARMOUR THYROID 90 MG tablet; Take 1 tablet (90 mg total) by mouth daily.  Dispense: 30 tablet; Refill: 5 - ARMOUR THYROID 60 MG tablet; Take 1 tablet (60 mg total) by mouth daily before breakfast.  Dispense: 30 tablet; Refill: 5 - CMP14+EGFR - Thyroid Panel With TSH  2. GAD  (generalized anxiety disorder) Stress managememt - buPROPion (WELLBUTRIN SR) 150 MG 12 hr tablet; Take 1 tablet (150 mg total) by mouth daily.  Dispense: 30 tablet; Refill: 5  3. Cough 1. Take meds as prescribed 2. Use a cool mist humidifier especially during the winter months and when heat has been humid. 3. Use saline nose sprays frequently 4. Saline irrigations of the nose can be very helpful if done frequently.  * 4X daily for 1 week*  * Use of a nettie pot can be helpful with this. Follow directions with this* 5. Drink plenty of fluids 6. Keep thermostat turn down low 7.For any cough or congestion  Use plain Mucinex- regular strength or max strength is fine   * Children- consult with Pharmacist for dosing 8. For fever or aces or pains- take tylenol or ibuprofen appropriate for age and weight.  * for fevers greater than 101 orally you may alternate ibuprofen and tylenol every  3 hours.    - benzonatate (TESSALON PERLES) 100 MG capsule; Take 1 capsule (100 mg total) by mouth 3 (three) times daily as needed for cough.  Dispense: 20 capsule; Refill: 0  Mary-Margaret Hassell Done, FNP

## 2015-07-22 NOTE — Patient Instructions (Signed)

## 2015-07-23 LAB — CMP14+EGFR
ALK PHOS: 90 IU/L (ref 39–117)
ALT: 14 IU/L (ref 0–32)
AST: 13 IU/L (ref 0–40)
Albumin/Globulin Ratio: 2 (ref 1.2–2.2)
Albumin: 4.1 g/dL (ref 3.5–5.5)
BUN/Creatinine Ratio: 7 — ABNORMAL LOW (ref 9–23)
BUN: 4 mg/dL — ABNORMAL LOW (ref 6–24)
Bilirubin Total: 0.2 mg/dL (ref 0.0–1.2)
CALCIUM: 9.2 mg/dL (ref 8.7–10.2)
CO2: 23 mmol/L (ref 18–29)
CREATININE: 0.54 mg/dL — AB (ref 0.57–1.00)
Chloride: 101 mmol/L (ref 96–106)
GFR calc Af Amer: 131 mL/min/{1.73_m2} (ref >59)
GFR, EST NON AFRICAN AMERICAN: 114 mL/min/{1.73_m2} (ref >59)
GLUCOSE: 81 mg/dL (ref 65–99)
Globulin, Total: 2.1 g/dL (ref 1.5–4.5)
Potassium: 4.1 mmol/L (ref 3.5–5.2)
Sodium: 142 mmol/L (ref 134–144)
Total Protein: 6.2 g/dL (ref 6.0–8.5)

## 2015-07-23 LAB — THYROID PANEL WITH TSH
FREE THYROXINE INDEX: 2.3 (ref 1.2–4.9)
T3 Uptake Ratio: 27 % (ref 24–39)
T4 TOTAL: 8.5 ug/dL (ref 4.5–12.0)
TSH: <0.006 u[IU]/mL — ABNORMAL LOW (ref 0.450–4.500)

## 2015-07-25 NOTE — Addendum Note (Signed)
Addended by: Shelbie Ammons on: 07/25/2015 11:38 AM   Modules accepted: Orders

## 2015-07-30 ENCOUNTER — Ambulatory Visit (INDEPENDENT_AMBULATORY_CARE_PROVIDER_SITE_OTHER): Payer: BC Managed Care – PPO | Admitting: Family Medicine

## 2015-07-30 VITALS — BP 119/76 | HR 71 | Temp 97.0°F | Ht 66.0 in | Wt 201.2 lb

## 2015-07-30 DIAGNOSIS — J189 Pneumonia, unspecified organism: Secondary | ICD-10-CM | POA: Diagnosis not present

## 2015-07-30 MED ORDER — LEVOFLOXACIN 500 MG PO TABS: 500.0000 mg | ORAL_TABLET | Freq: Every day | ORAL | Status: DC

## 2015-07-30 NOTE — Progress Notes (Signed)
   HPI  Patient presents today with concern for pneumonia.  Patient explains that she had a hip replacement about 4 months ago, when she left the hospital she had some respiratory symptoms that have not completely resolved since that time. She was treated in January for strep pharyngitis with amoxicillin. About one month ago she was treated with Augmentin for acute sinusitis, after she finished the Augmentin she did have 2-4 days of almost complete improvement.  Over the last 2 weeks or so she's had worsening cough, shortness of breath, and left-sided 30 chest pain. She denies any leg swelling or history of blood clot.  No leg swelling Surgery 4 months ago  PMH: Smoking status noted ROS: Per HPI  Objective: BP 119/76 mmHg  Pulse 71  Temp(Src) 97 F (36.1 C) (Oral)  Ht 5\' 6"  (1.676 m)  Wt 201 lb 4 oz (91.286 kg)  BMI 32.50 kg/m2 Gen: NAD, alert, cooperative with exam HEENT: NCAT, EOMI, PERRL CV: RRR, good S1/S2, no murmur Resp: CTABL, no wheezes, non-labored Abd: SNTND, BS present, no guarding or organomegaly Ext: No edema, warm Neuro: Alert and oriented, No gross deficits  Assessment and plan:  # CAP Clinical Dx considering dyspnea, chest pain, and severe cough Did discuss possibility of blood clot, however unlikely, Wells score 0, no sign of DVT, and ill feeling causing much more concern for CAP Tx with levaquin Probiotics recommended, discussed danger of C diff No X ray available today RTC with any concerns, reasons to seek emergency medical care also discussed   Meds ordered this encounter  Medications  . levofloxacin (LEVAQUIN) 500 MG tablet    Sig: Take 1 tablet (500 mg total) by mouth daily.    Dispense:  7 tablet    Refill:  0    Laroy Apple, MD [facility] 07/30/2015, 12:01 PM

## 2015-07-30 NOTE — Patient Instructions (Signed)
Great to see you!  Acute Bronchitis Bronchitis is when the airways that extend from the windpipe into the lungs get red, puffy, and painful (inflamed). Bronchitis often causes thick spit (mucus) to develop. This leads to a cough. A cough is the most common symptom of bronchitis. In acute bronchitis, the condition usually begins suddenly and goes away over time (usually in 2 weeks). Smoking, allergies, and asthma can make bronchitis worse. Repeated episodes of bronchitis may cause more lung problems. HOME CARE  Rest.  Drink enough fluids to keep your pee (urine) clear or pale yellow (unless you need to limit fluids as told by your doctor).  Only take over-the-counter or prescription medicines as told by your doctor.  Avoid smoking and secondhand smoke. These can make bronchitis worse. If you are a smoker, think about using nicotine gum or skin patches. Quitting smoking will help your lungs heal faster.  Reduce the chance of getting bronchitis again by:  Washing your hands often.  Avoiding people with cold symptoms.  Trying not to touch your hands to your mouth, nose, or eyes.  Follow up with your doctor as told. GET HELP IF: Your symptoms do not improve after 1 week of treatment. Symptoms include:  Cough.  Fever.  Coughing up thick spit.  Body aches.  Chest congestion.  Chills.  Shortness of breath.  Sore throat. GET HELP RIGHT AWAY IF:   You have an increased fever.  You have chills.  You have severe shortness of breath.  You have bloody thick spit (sputum).  You throw up (vomit) often.  You lose too much body fluid (dehydration).  You have a severe headache.  You faint. MAKE SURE YOU:   Understand these instructions.  Will watch your condition.  Will get help right away if you are not doing well or get worse.   This information is not intended to replace advice given to you by your health care provider. Make sure you discuss any questions you have  with your health care provider.   Document Released: 09/26/2007 Document Revised: 12/10/2012 Document Reviewed: 09/30/2012 Elsevier Interactive Patient Education [employer].

## 2015-10-17 ENCOUNTER — Telehealth: Payer: Self-pay | Admitting: Family

## 2015-10-18 NOTE — Telephone Encounter (Signed)
Labs DOS 08-26-2012,06-29-2013,07-22-2015 faxed to [phone]

## 2015-10-19 NOTE — Telephone Encounter (Signed)
We did not refer pt to therapy. Dr Ronaldo Miyamoto is the referring provider. Lm for [facility] retrieval to call back

## 2015-11-14 ENCOUNTER — Encounter: Payer: Self-pay | Admitting: Family

## 2015-11-14 ENCOUNTER — Ambulatory Visit (INDEPENDENT_AMBULATORY_CARE_PROVIDER_SITE_OTHER): Payer: BC Managed Care – PPO | Admitting: Family

## 2015-11-14 VITALS — BP 131/87 | HR 76 | Temp 98.4°F | Ht 66.0 in | Wt 204.0 lb

## 2015-11-14 DIAGNOSIS — F411 Generalized anxiety disorder: Secondary | ICD-10-CM

## 2015-11-14 DIAGNOSIS — E890 Postprocedural hypothyroidism: Secondary | ICD-10-CM

## 2015-11-14 DIAGNOSIS — M2550 Pain in unspecified joint: Secondary | ICD-10-CM

## 2015-11-14 DIAGNOSIS — F902 Attention-deficit hyperactivity disorder, combined type: Secondary | ICD-10-CM | POA: Diagnosis not present

## 2015-11-14 DIAGNOSIS — E039 Hypothyroidism, unspecified: Secondary | ICD-10-CM | POA: Insufficient documentation

## 2015-11-14 MED ORDER — VYVANSE 60 MG PO CAPS: 60.0000 mg | ORAL_CAPSULE | ORAL | 0 refills | Status: DC

## 2015-11-14 MED ORDER — LISDEXAMFETAMINE DIMESYLATE 60 MG PO CAPS: 60.0000 mg | ORAL_CAPSULE | ORAL | 0 refills | Status: DC

## 2015-11-14 NOTE — Progress Notes (Signed)
Subjective:    Patient ID: Jordan [name], female    DOB: 02-Apr-1971, 45 y.o.   MRN: [id number]  Pt presents to the office today for ADHD. SHe is currently on Vyvanse 4m daily. Pt states this is working well and is able to stay focused at worked. No complaint of side effects.  Arthritis  Presents for initial visit. The disease course has been fluctuating. She complains of pain, stiffness, joint swelling and joint warmth. Affected locations include the right elbow, left elbow, right MCP, left MCP, left hip, left knee, right knee and right hip. Her pain is at a severity of 6/10. Associated symptoms include pain at night and pain while resting. Her pertinent risk factors include overuse. Her family medical history includes family history of lupus, family history of rheumatoid arthritis and family history of unspecified arthritis. Past treatments include OTC med and rest. The treatment provided mild relief.  Anxiety  Presents for follow-up visit. Symptoms include excessive worry, irritability, nervous/anxious behavior and panic (at times). Patient reports no depressed mood, palpitations or shortness of breath. Symptoms occur occasionally. The severity of symptoms is moderate.    Thyroid Problem  Presents for follow-up visit. Symptoms include anxiety and dry skin. Patient reports no depressed mood, hoarse voice or palpitations. The symptoms have been stable.  .   Review of Systems  Constitutional: Positive for irritability.  HENT: Negative for hoarse voice.   Eyes: Negative.   Respiratory: Negative.  Negative for shortness of breath.   Cardiovascular: Negative.  Negative for palpitations.  Gastrointestinal: Negative.   Endocrine: Negative.   Genitourinary: Negative.   Musculoskeletal: Positive for arthritis, joint swelling and stiffness.  Hematological: Negative.   Psychiatric/Behavioral: The patient is nervous/anxious.   All other systems reviewed and are negative.      Objective:     Physical Exam  Constitutional: She is oriented to person, place, and time. She appears well-developed and well-nourished. No distress.  HENT:  Head: Normocephalic and atraumatic.  Left Ear: There is tenderness. A middle ear effusion is present.  Nose: Left sinus exhibits maxillary sinus tenderness and frontal sinus tenderness.  Eyes: Pupils are equal, round, and reactive to light.  Neck: Normal range of motion. Neck supple. No thyromegaly present.  Cardiovascular: Normal rate, regular rhythm, normal heart sounds and intact distal pulses.   No murmur heard. Pulmonary/Chest: Effort normal and breath sounds normal. No respiratory distress. She has no wheezes.  Abdominal: Soft. Bowel sounds are normal. She exhibits no distension. There is no tenderness.  Musculoskeletal: She exhibits no edema or tenderness.  Unable to fully extended right arm related to pain and swelling in elbow. No redness or warmth present   Neurological: She is alert and oriented to person, place, and time.  Skin: Skin is warm and dry.  Psychiatric: She has a normal mood and affect. Her behavior is normal. Judgment and thought content normal.  Vitals reviewed.   BP 131/87   Pulse 76   Temp 98.4 F (36.9 C) (Oral)   Ht _0  (1.676 m)   Wt 204 lb (92.5 kg)   BMI 32.93 kg/m        Assessment & Plan:  1. ADHD (attention deficit hyperactivity disorder), combined type - CMP14+EGFR - VYVANSE 60 MG capsule; Take 1 capsule (60 mg total) by mouth every morning.  Dispense: 30 capsule; Refill: 0 - lisdexamfetamine (VYVANSE) 60 MG capsule; Take 1 capsule (60 mg total) by mouth every morning. Do not fill until 30 days from  prescription date  Dispense: 30 capsule; Refill: 0 - lisdexamfetamine (VYVANSE) 60 MG capsule; Take 1 capsule (60 mg total) by mouth every morning.  Dispense: 30 capsule; Refill: 0  2. GAD (generalized anxiety disorder) - CMP14+EGFR  3. Postoperative hypothyroidism - CMP14+EGFR - Thyroid Panel  With TSH  4. Joint pain - CMP14+EGFR - Arthritis Panel - Thyroid Panel With TSH   Continue all meds Labs pending Health Maintenance reviewed Diet and exercise encouraged RTO 3 months  [name], FNP

## 2015-11-14 NOTE — Patient Instructions (Signed)

## 2015-11-15 ENCOUNTER — Other Ambulatory Visit: Payer: Self-pay | Admitting: Family

## 2015-11-15 DIAGNOSIS — E041 Nontoxic single thyroid nodule: Secondary | ICD-10-CM

## 2015-11-15 LAB — CMP14+EGFR
ALBUMIN: 4.4 g/dL (ref 3.5–5.5)
ALK PHOS: 106 IU/L (ref 39–117)
ALT: 12 IU/L (ref 0–32)
AST: 10 IU/L (ref 0–40)
Albumin/Globulin Ratio: 1.8 (ref 1.2–2.2)
BILIRUBIN TOTAL: 0.3 mg/dL (ref 0.0–1.2)
BUN / CREAT RATIO: 5 — AB (ref 9–23)
BUN: 4 mg/dL — AB (ref 6–24)
CHLORIDE: 99 mmol/L (ref 96–106)
CO2: 26 mmol/L (ref 18–29)
Calcium: 9.7 mg/dL (ref 8.7–10.2)
Creatinine, Ser: 0.76 mg/dL (ref 0.57–1.00)
GFR calc Af Amer: 109 mL/min/{1.73_m2} (ref >59)
GFR calc non Af Amer: 94 mL/min/{1.73_m2} (ref >59)
GLUCOSE: 91 mg/dL (ref 65–99)
Globulin, Total: 2.4 g/dL (ref 1.5–4.5)
Potassium: 4.4 mmol/L (ref 3.5–5.2)
SODIUM: 138 mmol/L (ref 134–144)
Total Protein: 6.8 g/dL (ref 6.0–8.5)

## 2015-11-15 LAB — ARTHRITIS PANEL
BASOS ABS: 0.1 10*3/uL (ref 0.0–0.2)
Basos: 1 %
EOS (ABSOLUTE): 0.4 10*3/uL (ref 0.0–0.4)
EOS: 4 %
HEMOGLOBIN: 14.5 g/dL (ref 11.1–15.9)
Hematocrit: 41.9 % (ref 34.0–46.6)
Immature Grans (Abs): 0.1 10*3/uL (ref 0.0–0.1)
Immature Granulocytes: 1 %
LYMPHS ABS: 2.5 10*3/uL (ref 0.7–3.1)
Lymphs: 27 %
MCH: 31.3 pg (ref 26.6–33.0)
MCHC: 34.6 g/dL (ref 31.5–35.7)
MCV: 90 fL (ref 79–97)
MONOS ABS: 0.6 10*3/uL (ref 0.1–0.9)
Monocytes: 6 %
NEUTROS PCT: 61 %
Neutrophils Absolute: 5.9 10*3/uL (ref 1.4–7.0)
PLATELETS: 377 10*3/uL (ref 150–379)
RBC: 4.64 x10E6/uL (ref 3.77–5.28)
RDW: 13.3 % (ref 12.3–15.4)
Sed Rate: 2 mm/hr (ref 0–32)
URIC ACID: 5.2 mg/dL (ref 2.5–7.1)
WBC: 9.4 10*3/uL (ref 3.4–10.8)

## 2015-11-15 LAB — THYROID PANEL WITH TSH
Free Thyroxine Index: 1.2 (ref 1.2–4.9)
T3 Uptake Ratio: 21 % — ABNORMAL LOW (ref 24–39)
T4 TOTAL: 5.9 ug/dL (ref 4.5–12.0)
TSH: 0.097 u[IU]/mL — ABNORMAL LOW (ref 0.450–4.500)

## 2015-11-15 MED ORDER — ARMOUR THYROID 60 MG PO TABS: 60.0000 mg | ORAL_TABLET | Freq: Every day | ORAL | 1 refills | Status: DC

## 2015-11-15 NOTE — Telephone Encounter (Signed)
-----   Message from Sharion Balloon, [place] sent at 11/15/2015  2:18 PM EDT ----- Will decrease to 60 mg daily. Prescription sent to pharmacy

## 2015-11-16 ENCOUNTER — Telehealth: Payer: Self-pay

## 2015-11-16 DIAGNOSIS — M2550 Pain in unspecified joint: Secondary | ICD-10-CM

## 2015-11-16 MED ORDER — THYROID 130 MG PO TABS: 130.0000 mg | ORAL_TABLET | Freq: Every day | ORAL | 1 refills | Status: DC

## 2015-11-16 NOTE — Telephone Encounter (Signed)
Referral placed to [facility]

## 2015-11-16 NOTE — Telephone Encounter (Signed)
TSH abnormal- I have decreased armour dose to 130 mg daily. Pt was taking 90 mg and 60 mg for a total dose of 150 mg. Recheck thyroid labs 8 weeks

## 2015-11-16 NOTE — Telephone Encounter (Signed)
Patient aware.

## 2015-11-24 ENCOUNTER — Telehealth: Payer: Self-pay

## 2015-11-28 NOTE — Telephone Encounter (Signed)
x

## 2016-01-19 ENCOUNTER — Ambulatory Visit: Payer: BC Managed Care – PPO | Admitting: Family

## 2016-01-20 ENCOUNTER — Ambulatory Visit (INDEPENDENT_AMBULATORY_CARE_PROVIDER_SITE_OTHER): Payer: BC Managed Care – PPO | Admitting: Family

## 2016-01-20 ENCOUNTER — Other Ambulatory Visit: Payer: Self-pay

## 2016-01-20 ENCOUNTER — Encounter: Payer: Self-pay | Admitting: Family

## 2016-01-20 ENCOUNTER — Ambulatory Visit: Payer: BC Managed Care – PPO | Admitting: Family

## 2016-01-20 VITALS — BP 132/86 | HR 89 | Temp 97.7°F | Ht 66.0 in | Wt 198.0 lb

## 2016-01-20 DIAGNOSIS — F411 Generalized anxiety disorder: Secondary | ICD-10-CM

## 2016-01-20 DIAGNOSIS — M069 Rheumatoid arthritis, unspecified: Secondary | ICD-10-CM | POA: Insufficient documentation

## 2016-01-20 DIAGNOSIS — M1990 Unspecified osteoarthritis, unspecified site: Secondary | ICD-10-CM

## 2016-01-20 DIAGNOSIS — F902 Attention-deficit hyperactivity disorder, combined type: Secondary | ICD-10-CM | POA: Diagnosis not present

## 2016-01-20 DIAGNOSIS — E890 Postprocedural hypothyroidism: Secondary | ICD-10-CM

## 2016-01-20 MED ORDER — LISDEXAMFETAMINE DIMESYLATE 60 MG PO CAPS: 60.0000 mg | ORAL_CAPSULE | ORAL | 0 refills | Status: DC

## 2016-01-20 MED ORDER — PREDNISONE 20 MG PO TABS: ORAL_TABLET | ORAL | 0 refills | Status: DC

## 2016-01-20 MED ORDER — BUPROPION HCL ER (XL) 300 MG PO TB24: 300.0000 mg | ORAL_TABLET | Freq: Every day | ORAL | 1 refills | Status: DC

## 2016-01-20 MED ORDER — VYVANSE 60 MG PO CAPS: 60.0000 mg | ORAL_CAPSULE | ORAL | 0 refills | Status: DC

## 2016-01-20 MED ORDER — THYROID 130 MG PO TABS
130.0000 mg | ORAL_TABLET | Freq: Every day | ORAL | 9 refills | Status: DC
Start: 2016-01-20 — End: 2016-02-10

## 2016-01-20 NOTE — Patient Instructions (Signed)

## 2016-01-20 NOTE — Progress Notes (Signed)
Subjective:    Patient ID: Jordan [name], female    DOB: October 28, 1970, 45 y.o.   MRN: [id number]  Pt presents to the office today for ADHD. SHe is currently on Vyvanse 60mg  daily. Pt states this is working well and is able to stay focused at worked. No complaint of side effects.  Medication Refill  Associated symptoms include joint swelling.  Arthritis  Presents for initial visit. The disease course has been fluctuating. She complains of pain, stiffness, joint swelling and joint warmth. Affected locations include the right elbow, left elbow, right MCP, left MCP, left hip, left knee, right knee and right hip. Her pain is at a severity of 7/10. Associated symptoms include pain at night and pain while resting. Her pertinent risk factors include overuse. Her family medical history includes family history of lupus, family history of rheumatoid arthritis and family history of unspecified arthritis. Past treatments include OTC med and rest. The treatment provided mild relief.  Anxiety  Presents for follow-up visit. Symptoms include excessive worry, irritability, nervous/anxious behavior and panic (at times). Patient reports no depressed mood, palpitations or shortness of breath. Symptoms occur occasionally. The severity of symptoms is moderate.    Thyroid Problem  Presents for follow-up visit. Symptoms include anxiety and dry skin. Patient reports no depressed mood, hoarse voice or palpitations. The symptoms have been stable.  .   Review of Systems  Constitutional: Positive for irritability.  HENT: Negative for hoarse voice.   Eyes: Negative.   Respiratory: Negative.  Negative for shortness of breath.   Cardiovascular: Negative.  Negative for palpitations.  Gastrointestinal: Negative.   Endocrine: Negative.   Genitourinary: Negative.   Musculoskeletal: Positive for arthritis, joint swelling and stiffness.  Hematological: Negative.   Psychiatric/Behavioral: The patient is nervous/anxious.     All other systems reviewed and are negative.      Objective:   Physical Exam  Constitutional: She is oriented to person, place, and time. She appears well-developed and well-nourished. No distress.  HENT:  Head: Normocephalic and atraumatic.  Nose: Left sinus exhibits maxillary sinus tenderness and frontal sinus tenderness.  Eyes: Pupils are equal, round, and reactive to light.  Neck: Normal range of motion. Neck supple. No thyromegaly present.  Cardiovascular: Normal rate, regular rhythm, normal heart sounds and intact distal pulses.   No murmur heard. Pulmonary/Chest: Effort normal and breath sounds normal. No respiratory distress. She has no wheezes.  Abdominal: Soft. Bowel sounds are normal. She exhibits no distension. There is no tenderness.  Musculoskeletal: She exhibits no edema or tenderness.  Unable to fully extended right arm related to pain and swelling in elbow. No redness or warmth present   Neurological: She is alert and oriented to person, place, and time.  Skin: Skin is warm and dry.  Psychiatric: She has a normal mood and affect. Her behavior is normal. Judgment and thought content normal.  Vitals reviewed.   BP 132/86   Pulse 89   Temp 97.7 F (36.5 C) (Oral)   Ht 5\' 6"  (1.676 m)   Wt 198 lb (89.8 kg)   BMI 31.96 kg/m        Assessment & Plan:  1. Postoperative hypothyroidism  2. ADHD (attention deficit hyperactivity disorder), combined type -Meds as prescribed Behavior modification as needed Follow-up for recheck in 3  months - lisdexamfetamine (VYVANSE) 60 MG capsule; Take 1 capsule (60 mg total) by mouth every morning. Do not fill until 30 days from prescription date  Dispense: 30 capsule; Refill: 0 -  lisdexamfetamine (VYVANSE) 60 MG capsule; Take 1 capsule (60 mg total) by mouth every morning.  Dispense: 30 capsule; Refill: 0 - VYVANSE 60 MG capsule; Take 1 capsule (60 mg total) by mouth every morning.  Dispense: 30 capsule; Refill: 0  3. GAD  (generalized anxiety disorder) -Wellbutrin increased to 300 mg from 150 mg -Stress management discussed  - buPROPion (WELLBUTRIN XL) 300 MG 24 hr tablet; Take 1 tablet (300 mg total) by mouth daily.  Dispense: 90 tablet; Refill: 1  4. Arthritis -Will give rx prednisone -ROM exercises  encouraged  - predniSONE (DELTASONE) 20 MG tablet; 2 po at same time daily for 5 days  Dispense: 10 tablet; Refill: 0  [name], FNP

## 2016-01-30 ENCOUNTER — Encounter: Payer: Self-pay | Admitting: Physician Assistant

## 2016-01-30 ENCOUNTER — Ambulatory Visit (INDEPENDENT_AMBULATORY_CARE_PROVIDER_SITE_OTHER): Payer: BC Managed Care – PPO | Admitting: Physician Assistant

## 2016-01-30 VITALS — BP 134/85 | HR 93 | Temp 97.5°F | Ht 66.0 in | Wt 193.6 lb

## 2016-01-30 DIAGNOSIS — M791 Myalgia, unspecified site: Secondary | ICD-10-CM

## 2016-01-30 DIAGNOSIS — L03221 Cellulitis of neck: Secondary | ICD-10-CM

## 2016-01-30 DIAGNOSIS — T63304A Toxic effect of unspecified spider venom, undetermined, initial encounter: Secondary | ICD-10-CM

## 2016-01-30 MED ORDER — PREDNISONE 10 MG PO TABS: 10.0000 mg | ORAL_TABLET | Freq: Every day | ORAL | 0 refills | Status: DC

## 2016-01-30 MED ORDER — CEPHALEXIN 500 MG PO CAPS: 500.0000 mg | ORAL_CAPSULE | Freq: Four times a day (QID) | ORAL | 0 refills | Status: DC

## 2016-01-30 NOTE — Patient Instructions (Addendum)
Spider Bite Spider bites are not common. When spider bites do happen, most do not cause serious health problems. There are only a few types of spider bites that can cause serious health problems. CAUSES A spider bite usually happens when a person accidentally makes contact with a spider in a way that traps the spider against the person's skin. SYMPTOMS Symptoms may vary depending on the type of spider. Some spider bites may cause symptoms within 1 hour after the bite. For other spider bites, it may take 1-2 days for symptoms to develop. Common symptoms include:  Redness and swelling in the area of the bite.  Discomfort or pain in the area of the bite. A few types of spiders, such as the black widow spider or the brown recluse spider, can inject poison (venom) into a bite wound. This venom causes more serious symptoms. Symptoms of a venomous spider bite vary, and may include:  Muscle cramps.  Nausea, vomiting, or abdominal pain.  Fever.  A skin sore (lesion) that spreads. This can break into an open wound (skin ulcer).  Light-headedness or dizziness. DIAGNOSIS This condition may be diagnosed based on your symptoms and a physical exam. Your health care provider will ask about the history of your injury and any details you may have about the spider. This may help to determine what type of spider it was that bit you. TREATMENT Many spider bites do not require treatment. If needed, treatment may include:  Icing and keeping the bite area raised (elevated).  Over-the-counter or prescription medicines to help control symptoms.  A tetanus shot.  Antibiotic medicines. HOME CARE INSTRUCTIONS Medicines  Take or apply over-the-counter and prescription medicines only as told by your health care provider.  If you were prescribed an antibiotic medicine, take or apply it as told by your health care provider. Do not stop using the antibiotic even if your condition improves. General  Instructions  Do not scratch the bite area.  Keep the bite area clean and dry. Wash the bite area daily with soap and water as told by your health care provider.  If directed, apply ice to the bite area.  Put ice in a plastic bag.  Place a towel between your skin and the bag.  Leave the ice on for 20 minutes, 2-3 times per day.  Elevate the affected area above the level of your heart while you are sitting or lying down, if possible.  Keep all follow-up visits as told by your health care provider. This is important. SEEK MEDICAL CARE IF:  Your bite does not get better after 3 days of treatment.  Your bite turns black or purple.  You have increased redness, swelling, or pain at the site of the bite. SEEK IMMEDIATE MEDICAL CARE IF:  You develop shortness of breath or chest pain.  You have fluid, blood, or pus coming from the bite area.  You have muscle cramps or painful muscle spasms.  You develop abdominal pain, nausea, or vomiting.  You feel unusually tired (fatigued) or sleepy.   This information is not intended to replace advice given to you by your health care provider. Make sure you discuss any questions you have with your health care provider.   Document Released: 05/17/2004 Document Revised: 12/29/2014 Document Reviewed: 08/25/2014 Elsevier Interactive Patient Education 2016 [place] [employer]

## 2016-01-31 NOTE — Progress Notes (Signed)
BP 134/85   Pulse 93   Temp 97.5 F (36.4 C) (Oral)   Ht 5\' 6"  (1.676 m)   Wt 193 lb 9.6 oz (87.8 kg)   BMI 31.25 kg/m    Subjective:    Patient ID: Jordan [name], female    DOB: 12/12/1970, 45 y.o.   MRN: [id number]  HPI: Jordan [name] is a 45 y.o. female presenting on 01/30/2016 for Insect Bite (Possible spider bite back of neck ) and Poison Oak  Patient was cleaning out a storage area. She got into a lot of spiders. She felt the bite on the back of her neck. She wasn't sure if it was specifically a spider that bit there but she assumes that it was because she had been moving the half of her body. She has no other bites this time. She does concomitantly have some contact dermatitis on her lower legs when she had been using over-the-counter medications for this.  Relevant past medical, surgical, family and social history reviewed and updated as indicated. Interim medical history since our last visit reviewed. Allergies and medications reviewed and updated. DATA REVIEWED: CHART IN EPIC  Social History   Social History  . Marital status: Divorced    Spouse name: N/A  . Number of children: N/A  . Years of education: N/A   Occupational History  . Not on file.   Social History Main Topics  . Smoking status: Current Every Day Smoker    Packs/day: 0.50    Years: 10.00    Types: Cigarettes  . Smokeless tobacco: Never Used     Comment: one and one half pack daily  . Alcohol use No  . Drug use: No  . Sexual activity: Not on file   Other Topics Concern  . Not on file   Social History Narrative  . No narrative on file    Past Surgical History:  Procedure Laterality Date  . childbirth     x3  . ENDOMETRIAL ABLATION  2007  . THYROID LOBECTOMY  06/26/2011   Procedure: THYROID LOBECTOMY;  Surgeon: Earnstine Regal, MD;  Location: [facility];  Service: [name];  Laterality: Left;  Left Thyroid Lobectomy  . TUBAL LIGATION  2003  . VEIN SURGERY  2004    Family History    Problem Relation Age of Onset  . Heart disease    . Thyroid disease    . Gallbladder disease    . Cancer    . Cancer Brother     colorectal  . Cancer Maternal Aunt     breast  . Heart disease Maternal Grandmother     Review of Systems  Constitutional: Negative.   HENT: Negative.   Eyes: Negative.   Respiratory: Negative.   Gastrointestinal: Negative.   Genitourinary: Negative.   Skin: Positive for rash and wound.      Medication List       Accurate as of 01/30/16 11:59 PM. Always use your most recent med list.          ALPRAZolam 0.5 MG tablet Commonly known as:  XANAX   buPROPion 300 MG 24 hr tablet Commonly known as:  WELLBUTRIN XL Take 1 tablet (300 mg total) by mouth daily.   cephALEXin 500 MG capsule Commonly known as:  KEFLEX Take 1 capsule (500 mg total) by mouth 4 (four) times daily.   levonorgestrel 20 MCG/24HR IUD Commonly known as:  MIRENA 1 each by Intrauterine route once.   lisdexamfetamine 60 MG  capsule Commonly known as:  VYVANSE Take 1 capsule (60 mg total) by mouth every morning. Do not fill until 30 days from prescription date   lisdexamfetamine 60 MG capsule Commonly known as:  VYVANSE Take 1 capsule (60 mg total) by mouth every morning.   VYVANSE 60 MG capsule Generic drug:  lisdexamfetamine Take 1 capsule (60 mg total) by mouth every morning.   mometasone 50 MCG/ACT nasal spray Commonly known as:  NASONEX Place 2 sprays into the nose daily.   predniSONE 10 MG tablet Commonly known as:  DELTASONE Take 1 tablet (10 mg total) by mouth daily with breakfast.   thyroid 130 MG tablet Commonly known as:  ARMOUR Take 1 tablet (130 mg total) by mouth daily.          Objective:    BP 134/85   Pulse 93   Temp 97.5 F (36.4 C) (Oral)   Ht 5\' 6"  (1.676 m)   Wt 193 lb 9.6 oz (87.8 kg)   BMI 31.25 kg/m   Allergies  Allergen Reactions  . Peanut-Containing Drug Products Itching and Swelling    Wt Readings from Last 3  Encounters:  01/30/16 193 lb 9.6 oz (87.8 kg)  01/20/16 198 lb (89.8 kg)  11/14/15 204 lb (92.5 kg)    Physical Exam  Constitutional: She is oriented to person, place, and time. She appears well-developed and well-nourished.  HENT:  Head: Normocephalic and atraumatic.  Eyes: Conjunctivae and EOM are normal. Pupils are equal, round, and reactive to light.  Cardiovascular: Normal rate, regular rhythm, normal heart sounds and intact distal pulses.   Pulmonary/Chest: Effort normal and breath sounds normal.  Abdominal: Soft. Bowel sounds are normal.  Neurological: She is alert and oriented to person, place, and time. She has normal reflexes.  Skin: Skin is warm and dry. Lesion and rash noted. No bruising noted. Rash is macular. There is erythema.     Area on middle neck, mild tenderness.  Psychiatric: She has a normal mood and affect. Her behavior is normal. Judgment and thought content normal.        Assessment & Plan:   1. Spider bite wound, undetermined intent, initial encounter - cephALEXin (KEFLEX) 500 MG capsule; Take 1 capsule (500 mg total) by mouth 4 (four) times daily.  Dispense: 40 capsule; Refill: 0  2. Cellulitis of neck - cephALEXin (KEFLEX) 500 MG capsule; Take 1 capsule (500 mg total) by mouth 4 (four) times daily.  Dispense: 40 capsule; Refill: 0  3. Myalgia - predniSONE (DELTASONE) 10 MG tablet; Take 1 tablet (10 mg total) by mouth daily with breakfast.  Dispense: 30 tablet; Refill: 0   Continue all other maintenance medications as listed above.  Follow up plan: Return if symptoms worsen or fail to improve.   Educational handout given for Spider bite  Terald Sleeper PA-C [facility] [address]  [place], [place] [address] [phone]   01/31/2016, 10:11 PM

## 2016-02-07 ENCOUNTER — Telehealth: Payer: Self-pay | Admitting: Family

## 2016-02-07 NOTE — Telephone Encounter (Signed)
She may comes and get depomedrol 80mg  IM

## 2016-02-07 NOTE — Telephone Encounter (Signed)
Let me confirm, she can come in for a nurse visit and get an injection of DepoMedrol 80 mg without seeing you?

## 2016-02-07 NOTE — Telephone Encounter (Signed)
May come in

## 2016-02-07 NOTE — Telephone Encounter (Signed)
yes

## 2016-02-09 ENCOUNTER — Telehealth: Payer: Self-pay | Admitting: Family

## 2016-02-09 NOTE — Telephone Encounter (Signed)
Spoke with pt and she states the poison oak is clearing up now and she doesn't think she needs to come in now.

## 2016-02-10 MED ORDER — THYROID 65 MG PO TABS: 130.0000 mg | ORAL_TABLET | Freq: Every day | ORAL | 1 refills | Status: DC

## 2016-02-10 NOTE — Telephone Encounter (Signed)
Prescription sent to pharmacy.

## 2016-02-13 ENCOUNTER — Other Ambulatory Visit: Payer: Self-pay | Admitting: Obstetrics and Gynecology

## 2016-02-13 DIAGNOSIS — Z803 Family history of malignant neoplasm of breast: Secondary | ICD-10-CM

## 2016-04-18 ENCOUNTER — Encounter: Payer: Self-pay | Admitting: Family

## 2016-04-18 ENCOUNTER — Ambulatory Visit (INDEPENDENT_AMBULATORY_CARE_PROVIDER_SITE_OTHER): Payer: BC Managed Care – PPO | Admitting: Family

## 2016-04-18 VITALS — BP 122/73 | HR 85 | Temp 96.7°F | Ht 66.0 in | Wt 201.0 lb

## 2016-04-18 DIAGNOSIS — E890 Postprocedural hypothyroidism: Secondary | ICD-10-CM

## 2016-04-18 DIAGNOSIS — M1990 Unspecified osteoarthritis, unspecified site: Secondary | ICD-10-CM | POA: Diagnosis not present

## 2016-04-18 DIAGNOSIS — F902 Attention-deficit hyperactivity disorder, combined type: Secondary | ICD-10-CM

## 2016-04-18 DIAGNOSIS — F411 Generalized anxiety disorder: Secondary | ICD-10-CM

## 2016-04-18 MED ORDER — THYROID 65 MG PO TABS: 130.0000 mg | ORAL_TABLET | Freq: Every day | ORAL | 1 refills | Status: DC

## 2016-04-18 MED ORDER — LISDEXAMFETAMINE DIMESYLATE 60 MG PO CAPS: 60.0000 mg | ORAL_CAPSULE | ORAL | 0 refills | Status: DC

## 2016-04-18 MED ORDER — VYVANSE 60 MG PO CAPS: 60.0000 mg | ORAL_CAPSULE | ORAL | 0 refills | Status: DC

## 2016-04-18 MED ORDER — BUPROPION HCL ER (XL) 300 MG PO TB24: 300.0000 mg | ORAL_TABLET | Freq: Every day | ORAL | 1 refills | Status: DC

## 2016-04-18 NOTE — Progress Notes (Signed)
Subjective:    Patient ID: Jordan [name], female    DOB: 1970-09-29, 45 y.o.   MRN: [id number]  Pt presents to the office today for ADHD. SHe is currently on Vyvanse 67m daily. Pt states this is working well and is able to stay focused at worked. No complaint of side effects.  Medication Refill  Associated symptoms include joint swelling.  Arthritis  Presents for initial visit. The disease course has been fluctuating. She complains of pain, stiffness, joint swelling and joint warmth. Affected locations include the right elbow, left elbow, right MCP, left MCP, left hip, left knee, right knee and right hip. Her pain is at a severity of 8/10. Associated symptoms include pain at night and pain while resting. Her pertinent risk factors include overuse. Her family medical history includes family history of lupus, family history of rheumatoid arthritis and family history of unspecified arthritis. Past treatments include OTC med and rest. The treatment provided mild relief.  Anxiety  Presents for follow-up visit. Symptoms include excessive worry, irritability, nervous/anxious behavior and panic (at times). Patient reports no depressed mood, palpitations or shortness of breath. Symptoms occur occasionally. The severity of symptoms is moderate.    Thyroid Problem  Presents for follow-up visit. Symptoms include anxiety and dry skin. Patient reports no depressed mood, hoarse voice or palpitations. The symptoms have been stable.  .   Review of Systems  Constitutional: Positive for irritability.  HENT: Negative for hoarse voice.   Eyes: Negative.   Respiratory: Negative.  Negative for shortness of breath.   Cardiovascular: Negative.  Negative for palpitations.  Gastrointestinal: Negative.   Endocrine: Negative.   Genitourinary: Negative.   Musculoskeletal: Positive for arthritis, joint swelling and stiffness.  Hematological: Negative.   Psychiatric/Behavioral: The patient is nervous/anxious.     All other systems reviewed and are negative.      Objective:   Physical Exam  Constitutional: She is oriented to person, place, and time. She appears well-developed and well-nourished. No distress.  HENT:  Head: Normocephalic and atraumatic.  Nose: Right sinus exhibits no maxillary sinus tenderness and no frontal sinus tenderness. Left sinus exhibits no maxillary sinus tenderness and no frontal sinus tenderness.  Eyes: Pupils are equal, round, and reactive to light.  Neck: Normal range of motion. Neck supple. No thyromegaly present.  Cardiovascular: Normal rate, regular rhythm, normal heart sounds and intact distal pulses.   No murmur heard. Pulmonary/Chest: Effort normal and breath sounds normal. No respiratory distress. She has no wheezes.  Abdominal: Soft. Bowel sounds are normal. She exhibits no distension. There is no tenderness.  Musculoskeletal: She exhibits no edema or tenderness.  Unable to fully extended right arm related to pain and swelling in elbow. No redness or warmth present   Neurological: She is alert and oriented to person, place, and time.  Skin: Skin is warm and dry.  Psychiatric: She has a normal mood and affect. Her behavior is normal. Judgment and thought content normal.  Vitals reviewed.   BP 122/73   Pulse 85   Temp (!) 96.7 F (35.9 C) (Oral)   Ht 5' 6"  (1.676 m)   Wt 201 lb (91.2 kg)   BMI 32.44 kg/m        Assessment & Plan:  1. Postoperative hypothyroidism - CMP14+EGFR - Thyroid Panel With TSH - thyroid (ARMOUR) 65 MG tablet; Take 2 tablets (130 mg total) by mouth daily.  Dispense: 180 tablet; Refill: 1  2. Arthritis - CMP14+EGFR  3. ADHD (attention deficit hyperactivity  disorder), combined type - CMP14+EGFR - lisdexamfetamine (VYVANSE) 60 MG capsule; Take 1 capsule (60 mg total) by mouth every morning. Do not fill until 30 days from prescription date  Dispense: 30 capsule; Refill: 0 - lisdexamfetamine (VYVANSE) 60 MG capsule; Take 1  capsule (60 mg total) by mouth every morning.  Dispense: 30 capsule; Refill: 0 - VYVANSE 60 MG capsule; Take 1 capsule (60 mg total) by mouth every morning.  Dispense: 30 capsule; Refill: 0  4. GAD (generalized anxiety disorder) - CMP14+EGFR - buPROPion (WELLBUTRIN XL) 300 MG 24 hr tablet; Take 1 tablet (300 mg total) by mouth daily.  Dispense: 90 tablet; Refill: 1   Continue all meds Labs pending Health Maintenance reviewed Diet and exercise encouraged RTO 3 months  [name], FNP

## 2016-04-18 NOTE — Patient Instructions (Signed)
Rheumatoid Arthritis Rheumatoid arthritis (RA) is a long-term (chronic) disease that causes inflammation in your joints. RA may start slowly. It usually affects the small joints of the hands and feet. Usually, the same joints are affected on both sides of your body. Inflammation from RA can also affect other parts of your body, including your heart, eyes, or lungs. RA is an autoimmune disease. That means that your body's defense system (immune system) mistakenly attacks healthy body tissues. There is no cure for RA, but medicines can help your symptoms and halt or slow down the progression of the disease. What are the causes? The exact cause of RA is not known. What increases the risk? This condition is more likely to develop in:  Women.  People who have a family history of RA or other autoimmune diseases. What are the signs or symptoms? Symptoms of this condition vary from person to person. Symptoms usually start gradually. They are often worse in the morning. The first symptom may be morning stiffness that lasts longer than 30 minutes. As RA progresses, symptoms may include:  Pain, stiffness, swelling, warmth, and tenderness in joints on both sides of your body.  Loss of energy.  Loss of appetite.  Weight loss.  Low-grade fever.  Dry eyes and dry mouth.  Firm lumps (rheumatoid nodules) that grow beneath your skin in areas such as your forearm bones near your elbows and on your hands.  Changes in the appearance of joints (deformity) and loss of joint function. Symptoms of RA often come and go. Sometimes, symptoms get worse for a period of time. These are called flares. How is this diagnosed? This condition is diagnosed based on your symptoms, medical history, and physical exam. You may have X-rays or MRI to check for the type of joint changes that are caused by RA. You may also have blood tests to look for:  Proteins (antibodies) that your immune system may make if you have RA.  They include rheumatoid factor (RF) and anti-CCP.  When blood tests show these proteins, you are said to have "seropositive RA."  When blood tests do not show these proteins, you may have "seronegative RA."  Inflammation in your blood.  A low number of red blood cells (anemia). How is this treated? The goals of treatment are to relieve pain, reduce inflammation, and slow down or stop joint damage and disability. Treatment may include:  Lifestyle changes. It is important to rest, eat a healthy diet, and exercise.  Medicines. Your health care provider may adjust your medicines every 3 months until treatment goals are reached. Common medicines include:  Pain relievers (analgesics).  Corticosteroids and NSAIDs to reduce inflammation.  Disease-modifying antirheumatic drugs (DMARDs) to try to slow the course of the disease.  Biologic response modifiers to reduce inflammation and damage.  Physical therapy and occupational therapy.  Surgery, if you have severe joint damage. Joint replacement or fusing of joints may be needed. Your health care provider will work with you to identify the best treatment option for you based on assessment of the overall disease activity in your body. Follow these instructions at home:  Take over-the-counter and prescription medicines only as told by your health care provider.  Start an exercise program as told by your health care provider.  Rest when you are having a flare.  Return to your normal activities as told by your health care provider. Ask your health care provider what activities are safe for you.  Keep all follow-up visits as told  follow-up visits as told by your health care provider. This is important. Where to find more information:  American College of Rheumatology: www.rheumatology.org  Arthritis Foundation: www.arthritis.org Contact a health care provider if:  You have a flare-up of RA symptoms.  You have a fever.  You have side effects from your  medicines. Get help right away if:  You have chest pain.  You have trouble breathing.  You quickly develop a hot, painful joint that is more severe than your usual joint aches. This information is not intended to replace advice given to you by your health care provider. Make sure you discuss any questions you have with your health care provider. Document Released: 04/06/2000 Document Revised: 09/13/2015 Document Reviewed: 01/20/2015 Elsevier Interactive Patient Education  2017 Elsevier Inc.  

## 2016-04-27 ENCOUNTER — Ambulatory Visit: Payer: BC Managed Care – PPO | Admitting: Family Medicine

## 2016-06-19 ENCOUNTER — Other Ambulatory Visit: Payer: Self-pay | Admitting: Nurse Practitioner

## 2016-06-19 DIAGNOSIS — E041 Nontoxic single thyroid nodule: Secondary | ICD-10-CM

## 2016-07-14 ENCOUNTER — Other Ambulatory Visit: Payer: Self-pay | Admitting: Nurse Practitioner

## 2016-07-14 DIAGNOSIS — E041 Nontoxic single thyroid nodule: Secondary | ICD-10-CM

## 2016-07-16 NOTE — Telephone Encounter (Signed)
See last TSH notes

## 2016-07-26 ENCOUNTER — Encounter: Payer: Self-pay | Admitting: Family

## 2016-07-26 ENCOUNTER — Ambulatory Visit (INDEPENDENT_AMBULATORY_CARE_PROVIDER_SITE_OTHER): Payer: BC Managed Care – PPO | Admitting: Family

## 2016-07-26 VITALS — BP 121/75 | HR 80 | Temp 98.5°F | Ht 66.0 in | Wt 201.0 lb

## 2016-07-26 DIAGNOSIS — F902 Attention-deficit hyperactivity disorder, combined type: Secondary | ICD-10-CM | POA: Diagnosis not present

## 2016-07-26 DIAGNOSIS — M1990 Unspecified osteoarthritis, unspecified site: Secondary | ICD-10-CM | POA: Diagnosis not present

## 2016-07-26 DIAGNOSIS — F411 Generalized anxiety disorder: Secondary | ICD-10-CM

## 2016-07-26 DIAGNOSIS — E890 Postprocedural hypothyroidism: Secondary | ICD-10-CM

## 2016-07-26 DIAGNOSIS — F17200 Nicotine dependence, unspecified, uncomplicated: Secondary | ICD-10-CM

## 2016-07-26 MED ORDER — LISDEXAMFETAMINE DIMESYLATE 60 MG PO CAPS
60.0000 mg | ORAL_CAPSULE | ORAL | 0 refills | Status: DC
Start: 2016-07-26 — End: 2016-10-23

## 2016-07-26 MED ORDER — LISDEXAMFETAMINE DIMESYLATE 60 MG PO CAPS: 60.0000 mg | ORAL_CAPSULE | ORAL | 0 refills | Status: DC

## 2016-07-26 MED ORDER — ALPRAZOLAM 0.5 MG PO TABS: 0.5000 mg | ORAL_TABLET | Freq: Every evening | ORAL | 4 refills | Status: DC | PRN

## 2016-07-26 MED ORDER — VYVANSE 60 MG PO CAPS: 60.0000 mg | ORAL_CAPSULE | ORAL | 0 refills | Status: DC

## 2016-07-26 NOTE — Progress Notes (Signed)
Subjective:    Patient ID: Jordan [name], female    DOB: 30-Apr-1970, 46 y.o.   MRN: [id number]  Pt presents to the office today for ADHD. SHe is currently on Vyvanse 34m daily. Pt states this is working well and is able to stay focused at worked. No complaint of side effects. Pt has RA and is followed by rheumatologists every 3 months. Pt was started on plaquenil and doing better.  Arthritis  Presents for initial visit. The disease course has been fluctuating. She complains of pain, stiffness and joint warmth. Affected locations include the right elbow, left elbow, right MCP, left MCP, left hip, left knee, right knee and right hip. Her pain is at a severity of 5/10. Associated symptoms include fatigue, pain at night and pain while resting. Her pertinent risk factors include overuse. Her family medical history includes family history of lupus, family history of rheumatoid arthritis and family history of unspecified arthritis. Past treatments include OTC med and rest. The treatment provided mild relief.  Anxiety  Presents for follow-up visit. Symptoms include excessive worry, irritability, nervous/anxious behavior and panic (at times). Patient reports no depressed mood, palpitations or shortness of breath. Symptoms occur occasionally. The severity of symptoms is moderate.    Thyroid Problem  Presents for follow-up visit. Symptoms include anxiety, dry skin and fatigue. Patient reports no depressed mood, hoarse voice or palpitations. The symptoms have been stable.  .   Review of Systems  Constitutional: Positive for fatigue and irritability.  HENT: Negative for hoarse voice.   Eyes: Negative.   Respiratory: Negative.  Negative for shortness of breath.   Cardiovascular: Negative.  Negative for palpitations.  Gastrointestinal: Negative.   Endocrine: Negative.   Genitourinary: Negative.   Musculoskeletal: Positive for arthritis and stiffness.  Hematological: Negative.     Psychiatric/Behavioral: The patient is nervous/anxious.   All other systems reviewed and are negative.      Objective:   Physical Exam  Constitutional: She is oriented to person, place, and time. She appears well-developed and well-nourished. No distress.  HENT:  Head: Normocephalic and atraumatic.  Nose: Right sinus exhibits no maxillary sinus tenderness and no frontal sinus tenderness. Left sinus exhibits no maxillary sinus tenderness and no frontal sinus tenderness.  Eyes: Pupils are equal, round, and reactive to light.  Neck: Normal range of motion. Neck supple. No thyromegaly present.  Cardiovascular: Normal rate, regular rhythm, normal heart sounds and intact distal pulses.   No murmur heard. Pulmonary/Chest: Effort normal and breath sounds normal. No respiratory distress. She has no wheezes.  Abdominal: Soft. Bowel sounds are normal. She exhibits no distension. There is no tenderness.  Musculoskeletal: She exhibits no edema or tenderness.  Generalized joint pain in bilateral arms, knees, and hips with movement    Neurological: She is alert and oriented to person, place, and time.  Skin: Skin is warm and dry.  Psychiatric: She has a normal mood and affect. Her behavior is normal. Judgment and thought content normal.  Vitals reviewed.   BP 121/75   Pulse 80   Temp 98.5 F (36.9 C) (Oral)   Ht _0  (1.676 m)   Wt 201 lb (91.2 kg)   BMI 32.44 kg/m       Assessment & Plan:  1. GAD (generalized anxiety disorder) - CMP14+EGFR - ALPRAZolam (XANAX) 0.5 MG tablet; Take 1 tablet (0.5 mg total) by mouth at bedtime as needed for anxiety.  Dispense: 30 tablet; Refill: 4  2. ADHD (attention deficit hyperactivity disorder),  combined type Meds as prescribed Behavior modification as needed Follow-up for recheck in 3 months - lisdexamfetamine (VYVANSE) 60 MG capsule; Take 1 capsule (60 mg total) by mouth every morning. Do not fill until 30 days from prescription date  Dispense:  30 capsule; Refill: 0 - lisdexamfetamine (VYVANSE) 60 MG capsule; Take 1 capsule (60 mg total) by mouth every morning.  Dispense: 30 capsule; Refill: 0 - VYVANSE 60 MG capsule; Take 1 capsule (60 mg total) by mouth every morning.  Dispense: 30 capsule; Refill: 0 - CMP14+EGFR  3. Arthritis - CMP14+EGFR  4. Postoperative hypothyroidism - CMP14+EGFR - Thyroid Panel With TSH  5. Current smoker - CMP14+EGFR   Continue all meds Labs pending Health Maintenance reviewed Diet and exercise encouraged RTO 3 months   [name], FNP

## 2016-07-26 NOTE — Patient Instructions (Signed)
Rheumatoid Arthritis Rheumatoid arthritis (RA) is a long-term (chronic) disease that causes inflammation in your joints. RA may start slowly. It usually affects the small joints of the hands and feet. Usually, the same joints are affected on both sides of your body. Inflammation from RA can also affect other parts of your body, including your heart, eyes, or lungs. RA is an autoimmune disease. That means that your body's defense system (immune system) mistakenly attacks healthy body tissues. There is no cure for RA, but medicines can help your symptoms and halt or slow down the progression of the disease. What are the causes? The exact cause of RA is not known. What increases the risk? This condition is more likely to develop in:  Women.  People who have a family history of RA or other autoimmune diseases. What are the signs or symptoms? Symptoms of this condition vary from person to person. Symptoms usually start gradually. They are often worse in the morning. The first symptom may be morning stiffness that lasts longer than 30 minutes. As RA progresses, symptoms may include:  Pain, stiffness, swelling, warmth, and tenderness in joints on both sides of your body.  Loss of energy.  Loss of appetite.  Weight loss.  Low-grade fever.  Dry eyes and dry mouth.  Firm lumps (rheumatoid nodules) that grow beneath your skin in areas such as your forearm bones near your elbows and on your hands.  Changes in the appearance of joints (deformity) and loss of joint function. Symptoms of RA often come and go. Sometimes, symptoms get worse for a period of time. These are called flares. How is this diagnosed? This condition is diagnosed based on your symptoms, medical history, and physical exam. You may have X-rays or MRI to check for the type of joint changes that are caused by RA. You may also have blood tests to look for:  Proteins (antibodies) that your immune system may make if you have RA.  They include rheumatoid factor (RF) and anti-CCP.  When blood tests show these proteins, you are said to have "seropositive RA."  When blood tests do not show these proteins, you may have "seronegative RA."  Inflammation in your blood.  A low number of red blood cells (anemia). How is this treated? The goals of treatment are to relieve pain, reduce inflammation, and slow down or stop joint damage and disability. Treatment may include:  Lifestyle changes. It is important to rest, eat a healthy diet, and exercise.  Medicines. Your health care provider may adjust your medicines every 3 months until treatment goals are reached. Common medicines include:  Pain relievers (analgesics).  Corticosteroids and NSAIDs to reduce inflammation.  Disease-modifying antirheumatic drugs (DMARDs) to try to slow the course of the disease.  Biologic response modifiers to reduce inflammation and damage.  Physical therapy and occupational therapy.  Surgery, if you have severe joint damage. Joint replacement or fusing of joints may be needed. Your health care provider will work with you to identify the best treatment option for you based on assessment of the overall disease activity in your body. Follow these instructions at home:  Take over-the-counter and prescription medicines only as told by your health care provider.  Start an exercise program as told by your health care provider.  Rest when you are having a flare.  Return to your normal activities as told by your health care provider. Ask your health care provider what activities are safe for you.  Keep all follow-up visits as told  follow-up visits as told by your health care provider. This is important. Where to find more information:  American College of Rheumatology: www.rheumatology.org  Arthritis Foundation: www.arthritis.org Contact a health care provider if:  You have a flare-up of RA symptoms.  You have a fever.  You have side effects from your  medicines. Get help right away if:  You have chest pain.  You have trouble breathing.  You quickly develop a hot, painful joint that is more severe than your usual joint aches. This information is not intended to replace advice given to you by your health care provider. Make sure you discuss any questions you have with your health care provider. Document Released: 04/06/2000 Document Revised: 09/13/2015 Document Reviewed: 01/20/2015 Elsevier Interactive Patient Education  2017 Elsevier Inc.  

## 2016-07-27 ENCOUNTER — Other Ambulatory Visit: Payer: Self-pay | Admitting: Family

## 2016-07-27 LAB — CMP14+EGFR
ALK PHOS: 84 IU/L (ref 39–117)
ALT: 14 IU/L (ref 0–32)
AST: 15 IU/L (ref 0–40)
Albumin/Globulin Ratio: 2.1 (ref 1.2–2.2)
Albumin: 4.2 g/dL (ref 3.5–5.5)
BUN / CREAT RATIO: 7 — AB (ref 9–23)
BUN: 5 mg/dL — ABNORMAL LOW (ref 6–24)
CHLORIDE: 99 mmol/L (ref 96–106)
CO2: 25 mmol/L (ref 18–29)
CREATININE: 0.75 mg/dL (ref 0.57–1.00)
Calcium: 9.8 mg/dL (ref 8.7–10.2)
GFR calc Af Amer: 110 mL/min/{1.73_m2} (ref >59)
GFR calc non Af Amer: 95 mL/min/{1.73_m2} (ref >59)
GLOBULIN, TOTAL: 2 g/dL (ref 1.5–4.5)
Glucose: 81 mg/dL (ref 65–99)
Potassium: 4.2 mmol/L (ref 3.5–5.2)
SODIUM: 141 mmol/L (ref 134–144)
Total Protein: 6.2 g/dL (ref 6.0–8.5)

## 2016-07-27 LAB — THYROID PANEL WITH TSH
Free Thyroxine Index: 1.6 (ref 1.2–4.9)
T3 Uptake Ratio: 25 % (ref 24–39)
T4 TOTAL: 6.2 ug/dL (ref 4.5–12.0)
TSH: 0.009 u[IU]/mL — AB (ref 0.450–4.500)

## 2016-07-27 MED ORDER — THYROID 130 MG PO TABS: 130.0000 mg | ORAL_TABLET | Freq: Every day | ORAL | 1 refills | Status: DC

## 2016-08-01 ENCOUNTER — Other Ambulatory Visit: Payer: Self-pay | Admitting: Family

## 2016-08-01 MED ORDER — THYROID 120 MG PO TABS: 120.0000 mg | ORAL_TABLET | Freq: Every day | ORAL | 1 refills | Status: DC

## 2016-08-01 NOTE — Progress Notes (Signed)
Armour thyroid changed to 120 mg from 130 mg per pharmacy request as they do not have 130 mg tablets.

## 2016-08-22 ENCOUNTER — Other Ambulatory Visit: Payer: Self-pay | Admitting: Family

## 2016-10-23 ENCOUNTER — Encounter: Payer: Self-pay | Admitting: Family

## 2016-10-23 ENCOUNTER — Ambulatory Visit (INDEPENDENT_AMBULATORY_CARE_PROVIDER_SITE_OTHER): Payer: BC Managed Care – PPO | Admitting: Family

## 2016-10-23 VITALS — BP 118/70 | HR 92 | Temp 98.3°F | Ht 66.0 in | Wt 194.6 lb

## 2016-10-23 DIAGNOSIS — F902 Attention-deficit hyperactivity disorder, combined type: Secondary | ICD-10-CM | POA: Diagnosis not present

## 2016-10-23 MED ORDER — LISDEXAMFETAMINE DIMESYLATE 70 MG PO CAPS: 70.0000 mg | ORAL_CAPSULE | Freq: Every day | ORAL | 0 refills | Status: DC

## 2016-10-23 NOTE — Progress Notes (Signed)
   Subjective:    Patient ID: Jordan [name], female    DOB: 30-May-1970, 46 y.o.   MRN: [id number]  HPI Pt presents to the office today for ADHD follow up. PT currently taking Vyvanse 60 mg daily. PT states this works well, but can tell around 1 pm that it is "wearing off". Pt states she would like to have this increased. Denies any weight changes or adverse reactions.    Review of Systems  Psychiatric/Behavioral: Positive for decreased concentration.  All other systems reviewed and are negative.      Objective:   Physical Exam  Constitutional: She is oriented to person, place, and time. She appears well-developed and well-nourished. No distress.  HENT:  Head: Normocephalic.  Eyes: Pupils are equal, round, and reactive to light.  Neck: Normal range of motion. Neck supple. No thyromegaly present.  Cardiovascular: Normal rate, regular rhythm, normal heart sounds and intact distal pulses.   No murmur heard. Pulmonary/Chest: Effort normal and breath sounds normal. No respiratory distress. She has no wheezes.  Abdominal: Soft. Bowel sounds are normal. She exhibits no distension. There is no tenderness.  Musculoskeletal: Normal range of motion. She exhibits no edema or tenderness.  Neurological: She is alert and oriented to person, place, and time.  Skin: Skin is warm and dry.  Psychiatric: She has a normal mood and affect. Her behavior is normal. Judgment and thought content normal.  Vitals reviewed.   BP 118/70   Pulse 92   Temp 98.3 F (36.8 C) (Oral)   Ht 5\' 6"  (1.676 m)   Wt 194 lb 9.6 oz (88.3 kg)   BMI 31.41 kg/m       Assessment & Plan:  1. ADHD (attention deficit hyperactivity disorder), combined type Meds as prescribed Behavior modification as needed Follow-up for recheck in 3 months - lisdexamfetamine (VYVANSE) 70 MG capsule; Take 1 capsule (70 mg total) by mouth daily.  Dispense: 30 capsule; Refill: 0 - lisdexamfetamine (VYVANSE) 70 MG capsule; Take 1 capsule  (70 mg total) by mouth daily.  Dispense: 30 capsule; Refill: 0 - lisdexamfetamine (VYVANSE) 70 MG capsule; Take 1 capsule (70 mg total) by mouth daily.  Dispense: 30 capsule; Refill: 0   [name], FNP

## 2016-10-23 NOTE — Patient Instructions (Signed)

## 2016-11-19 ENCOUNTER — Telehealth: Payer: Self-pay | Admitting: Family

## 2016-11-19 NOTE — Telephone Encounter (Signed)
FYI

## 2016-11-19 NOTE — Telephone Encounter (Signed)
Spoke with pharmacist at [facility] ok to fill 3 days early due to patient going out of town.

## 2016-11-20 NOTE — Telephone Encounter (Signed)
Yes this is fine.

## 2016-12-10 ENCOUNTER — Other Ambulatory Visit: Payer: Self-pay | Admitting: Family

## 2016-12-10 DIAGNOSIS — E041 Nontoxic single thyroid nodule: Secondary | ICD-10-CM

## 2016-12-11 ENCOUNTER — Other Ambulatory Visit: Payer: Self-pay | Admitting: Family

## 2016-12-11 MED ORDER — THYROID 120 MG PO TABS: 120.0000 mg | ORAL_TABLET | Freq: Every day | ORAL | 1 refills | Status: DC

## 2016-12-11 NOTE — Telephone Encounter (Signed)
See last labs, route to pool

## 2016-12-14 ENCOUNTER — Other Ambulatory Visit: Payer: Self-pay | Admitting: Family

## 2016-12-14 DIAGNOSIS — E041 Nontoxic single thyroid nodule: Secondary | ICD-10-CM

## 2016-12-17 ENCOUNTER — Other Ambulatory Visit: Payer: Self-pay | Admitting: Nurse Practitioner

## 2016-12-17 DIAGNOSIS — E041 Nontoxic single thyroid nodule: Secondary | ICD-10-CM

## 2016-12-18 NOTE — Telephone Encounter (Signed)
Pt aware. She doesn't need at this time, she will make appt when she needs a refill

## 2017-01-16 ENCOUNTER — Other Ambulatory Visit: Payer: Self-pay | Admitting: Family

## 2017-01-17 ENCOUNTER — Other Ambulatory Visit: Payer: Self-pay | Admitting: Family

## 2017-01-17 DIAGNOSIS — F411 Generalized anxiety disorder: Secondary | ICD-10-CM

## 2017-01-25 ENCOUNTER — Ambulatory Visit (INDEPENDENT_AMBULATORY_CARE_PROVIDER_SITE_OTHER): Payer: BC Managed Care – PPO | Admitting: Family

## 2017-01-25 ENCOUNTER — Encounter: Payer: Self-pay | Admitting: *Deleted

## 2017-01-25 VITALS — BP 122/68 | HR 98 | Temp 98.0°F | Ht 66.0 in | Wt 188.0 lb

## 2017-01-25 DIAGNOSIS — F17200 Nicotine dependence, unspecified, uncomplicated: Secondary | ICD-10-CM

## 2017-01-25 DIAGNOSIS — E890 Postprocedural hypothyroidism: Secondary | ICD-10-CM

## 2017-01-25 DIAGNOSIS — F411 Generalized anxiety disorder: Secondary | ICD-10-CM

## 2017-01-25 DIAGNOSIS — F902 Attention-deficit hyperactivity disorder, combined type: Secondary | ICD-10-CM | POA: Diagnosis not present

## 2017-01-25 MED ORDER — LISDEXAMFETAMINE DIMESYLATE 70 MG PO CAPS
70.0000 mg | ORAL_CAPSULE | Freq: Every day | ORAL | 0 refills | Status: DC
Start: 2017-01-25 — End: 2017-04-29

## 2017-01-25 MED ORDER — LISDEXAMFETAMINE DIMESYLATE 70 MG PO CAPS
70.0000 mg | ORAL_CAPSULE | Freq: Every day | ORAL | 0 refills | Status: DC
Start: 2017-01-25 — End: 2017-05-01

## 2017-01-25 MED ORDER — LISDEXAMFETAMINE DIMESYLATE 70 MG PO CAPS: 70.0000 mg | ORAL_CAPSULE | Freq: Every day | ORAL | 0 refills | Status: DC

## 2017-01-25 NOTE — Patient Instructions (Signed)
Hypothyroidism Hypothyroidism is a disorder of the thyroid. The thyroid is a large gland that is located in the lower front of the neck. The thyroid releases hormones that control how the body works. With hypothyroidism, the thyroid does not make enough of these hormones. What are the causes? Causes of hypothyroidism may include:  Viral infections.  Pregnancy.  Your own defense system (immune system) attacking your thyroid.  Certain medicines.  Birth defects.  Past radiation treatments to your head or neck.  Past treatment with radioactive iodine.  Past surgical removal of part or all of your thyroid.  Problems with the gland that is located in the center of your brain (pituitary).  What are the signs or symptoms? Signs and symptoms of hypothyroidism may include:  Feeling as though you have no energy (lethargy).  Inability to tolerate cold.  Weight gain that is not explained by a change in diet or exercise habits.  Dry skin.  Coarse hair.  Menstrual irregularity.  Slowing of thought processes.  Constipation.  Sadness or depression.  How is this diagnosed? Your health care provider may diagnose hypothyroidism with blood tests and ultrasound tests. How is this treated? Hypothyroidism is treated with medicine that replaces the hormones that your body does not make. After you begin treatment, it may take several weeks for symptoms to go away. Follow these instructions at home:  Take medicines only as directed by your health care provider.  If you start taking any new medicines, tell your health care provider.  Keep all follow-up visits as directed by your health care provider. This is important. As your condition improves, your dosage needs may change. You will need to have blood tests regularly so that your health care provider can watch your condition. Contact a health care provider if:  Your symptoms do not get better with treatment.  You are taking thyroid  replacement medicine and: ? You sweat excessively. ? You have tremors. ? You feel anxious. ? You lose weight rapidly. ? You cannot tolerate heat. ? You have emotional swings. ? You have diarrhea. ? You feel weak. Get help right away if:  You develop chest pain.  You develop an irregular heartbeat.  You develop a rapid heartbeat. This information is not intended to replace advice given to you by your health care provider. Make sure you discuss any questions you have with your health care provider. Document Released: 04/09/2005 Document Revised: 09/15/2015 Document Reviewed: 08/25/2013 Elsevier Interactive Patient Education  2017 Elsevier Inc.  

## 2017-01-25 NOTE — Progress Notes (Signed)
   Subjective:    Patient ID: Jordan [name], female    DOB: 1971-02-01, 46 y.o.   MRN: [id number]  Pt presents to the office today for chronic follow up. PT is seeing Rheumatologists every 3 months. PT currently on Plaquenil.  Thyroid Problem  Presents for follow-up visit. Symptoms include anxiety and depressed mood. Patient reports no constipation, diarrhea, dry skin or fatigue. The symptoms have been stable.  Arthritis  Presents for follow-up visit. She complains of pain and stiffness. The symptoms have been stable. Affected locations include the left elbow, right knee, right MCP, left MCP, left knee, right foot, left foot and right elbow. Her pain is at a severity of 7/10. Pertinent negatives include no diarrhea or fatigue.  Anxiety  Presents for follow-up visit. Symptoms include depressed mood, excessive worry, insomnia, irritability, nervous/anxious behavior and restlessness. Symptoms occur most days.    ADHD  PT was taking Vyvanse 70 mg and that was helping, but over the last week she has been out and can not stay focused or on task.    Review of Systems  Constitutional: Positive for irritability. Negative for fatigue.  Gastrointestinal: Negative for constipation and diarrhea.  Musculoskeletal: Positive for arthritis and stiffness.  Psychiatric/Behavioral: The patient is nervous/anxious and has insomnia.   All other systems reviewed and are negative.      Objective:   Physical Exam  Constitutional: She is oriented to person, place, and time. She appears well-developed and well-nourished. No distress.  HENT:  Head: Normocephalic and atraumatic.  Right Ear: External ear normal.  Left Ear: External ear normal.  Nose: Nose normal.  Mouth/Throat: Oropharynx is clear and moist.  Eyes: Pupils are equal, round, and reactive to light.  Neck: Normal range of motion. Neck supple. No thyromegaly present.  Cardiovascular: Normal rate, regular rhythm, normal heart sounds and intact  distal pulses.   No murmur heard. Pulmonary/Chest: Effort normal and breath sounds normal. No respiratory distress. She has no wheezes.  Abdominal: Soft. Bowel sounds are normal. She exhibits no distension. There is no tenderness.  Musculoskeletal: Normal range of motion. She exhibits tenderness (right elbow tenderness with extension). She exhibits no edema.  Neurological: She is alert and oriented to person, place, and time.  Skin: Skin is warm and dry.  Psychiatric: She has a normal mood and affect. Her behavior is normal. Judgment and thought content normal.  Vitals reviewed.    BP 122/68   Pulse 98   Temp 98 F (36.7 C) (Oral)   Ht '5\' 6"'$  (1.676 m)   Wt 188 lb (85.3 kg)   BMI 30.34 kg/m       Assessment & Plan:  1. ADHD (attention deficit hyperactivity disorder), combined type Meds as prescribed Behavior modification as needed Follow-up for recheck in 3 months - CMP14+EGFR - lisdexamfetamine (VYVANSE) 70 MG capsule; Take 1 capsule (70 mg total) by mouth daily.  Dispense: 30 capsule; Refill: 0 - lisdexamfetamine (VYVANSE) 70 MG capsule; Take 1 capsule (70 mg total) by mouth daily.  Dispense: 30 capsule; Refill: 0 - lisdexamfetamine (VYVANSE) 70 MG capsule; Take 1 capsule (70 mg total) by mouth daily.  Dispense: 30 capsule; Refill: 0  2. Current smoker Smoking cessation - CMP14+EGFR  3. GAD (generalized anxiety disorder) - CMP14+EGFR  4. Postoperative hypothyroidism - CMP14+EGFR - Thyroid Panel With TSH   Continue all meds Labs pending Health Maintenance reviewed Diet and exercise encouraged RTO 3 months   [name], FNP

## 2017-01-26 LAB — CMP14+EGFR
ALBUMIN: 4.3 g/dL (ref 3.5–5.5)
ALK PHOS: 88 IU/L (ref 39–117)
ALT: 10 IU/L (ref 0–32)
AST: 13 IU/L (ref 0–40)
Albumin/Globulin Ratio: 2.4 — ABNORMAL HIGH (ref 1.2–2.2)
BUN / CREAT RATIO: 9 (ref 9–23)
BUN: 7 mg/dL (ref 6–24)
Bilirubin Total: 0.2 mg/dL (ref 0.0–1.2)
CALCIUM: 9.9 mg/dL (ref 8.7–10.2)
CO2: 25 mmol/L (ref 20–29)
CREATININE: 0.78 mg/dL (ref 0.57–1.00)
Chloride: 103 mmol/L (ref 96–106)
GFR calc Af Amer: 105 mL/min/{1.73_m2} (ref >59)
GFR, EST NON AFRICAN AMERICAN: 91 mL/min/{1.73_m2} (ref >59)
GLUCOSE: 80 mg/dL (ref 65–99)
Globulin, Total: 1.8 g/dL (ref 1.5–4.5)
Potassium: 4.5 mmol/L (ref 3.5–5.2)
Sodium: 143 mmol/L (ref 134–144)
Total Protein: 6.1 g/dL (ref 6.0–8.5)

## 2017-01-26 LAB — THYROID PANEL WITH TSH
FREE THYROXINE INDEX: 2.5 (ref 1.2–4.9)
T3 Uptake Ratio: 28 % (ref 24–39)
T4 TOTAL: 9 ug/dL (ref 4.5–12.0)

## 2017-01-28 ENCOUNTER — Other Ambulatory Visit: Payer: Self-pay | Admitting: Family

## 2017-01-28 MED ORDER — THYROID 90 MG PO TABS: 90.0000 mg | ORAL_TABLET | Freq: Every day | ORAL | 1 refills | Status: DC

## 2017-02-28 ENCOUNTER — Other Ambulatory Visit: Payer: Self-pay | Admitting: Family

## 2017-03-04 ENCOUNTER — Ambulatory Visit: Payer: BC Managed Care – PPO | Admitting: Family

## 2017-03-05 ENCOUNTER — Encounter: Payer: Self-pay | Admitting: Family

## 2017-03-07 ENCOUNTER — Encounter: Payer: Self-pay | Admitting: Family

## 2017-03-07 ENCOUNTER — Ambulatory Visit: Payer: BC Managed Care – PPO | Admitting: Family

## 2017-03-07 ENCOUNTER — Ambulatory Visit (INDEPENDENT_AMBULATORY_CARE_PROVIDER_SITE_OTHER): Payer: BC Managed Care – PPO

## 2017-03-07 VITALS — BP 111/62 | HR 89 | Temp 98.0°F | Ht 66.0 in | Wt 190.0 lb

## 2017-03-07 DIAGNOSIS — Z01818 Encounter for other preprocedural examination: Secondary | ICD-10-CM

## 2017-03-07 NOTE — Progress Notes (Signed)
   Subjective:    Patient ID: Jordan [name], female    DOB: [birth year], 46 y.o.   MRN: [id number]  HPI Pt presents to the office today for surgical clearance for dental surgery that is ordered in the next month. Pt states she is having her lower teeth removed and implants placed.   PT has an follow up appt on 03/13/17 to discuss her procedure with the surgeon. Pt denies any headache, palpitations, SOB, or edema at this time.     Review of Systems  All other systems reviewed and are negative.      Objective:   Physical Exam  Constitutional: She is oriented to person, place, and time. She appears well-developed and well-nourished. No distress.  HENT:  Head: Normocephalic and atraumatic.  Right Ear: External ear normal.  Left Ear: External ear normal.  Nose: Nose normal.  Mouth/Throat: Oropharynx is clear and moist. Abnormal dentition.  Eyes: Pupils are equal, round, and reactive to light.  Neck: Normal range of motion. Neck supple. No thyromegaly present.  Cardiovascular: Normal rate, regular rhythm, normal heart sounds and intact distal pulses.  No murmur heard. Pulmonary/Chest: Effort normal and breath sounds normal. No respiratory distress. She has no wheezes.  Abdominal: Soft. Bowel sounds are normal. She exhibits no distension. There is no tenderness.  Musculoskeletal: Normal range of motion. She exhibits no edema or tenderness.  Neurological: She is alert and oriented to person, place, and time.  Skin: Skin is warm and dry.  Psychiatric: She has a normal mood and affect. Her behavior is normal. Judgment and thought content normal.  Vitals reviewed.   BP 111/62   Pulse 89   Temp 98 F (36.7 C) (Oral)   Ht 5' 6" (1.676 m)   Wt 190 lb (86.2 kg)   BMI 30.67 kg/m      Assessment & Plan:  1. Pre-operative clearance Labs pending Keep follow up with Surgeon RTO prn and keep chronic follow up  - DG Chest 2 View; Future - EKG 12-Lead - CBC with  Differential/Platelet - CMP14+EGFR  [name], FNP

## 2017-03-08 LAB — CMP14+EGFR
ALT: 13 IU/L (ref 0–32)
AST: 15 IU/L (ref 0–40)
Albumin/Globulin Ratio: 1.8 (ref 1.2–2.2)
Albumin: 4 g/dL (ref 3.5–5.5)
Alkaline Phosphatase: 89 IU/L (ref 39–117)
BUN/Creatinine Ratio: 7 — ABNORMAL LOW (ref 9–23)
BUN: 5 mg/dL — AB (ref 6–24)
Bilirubin Total: 0.4 mg/dL (ref 0.0–1.2)
CALCIUM: 9.9 mg/dL (ref 8.7–10.2)
CO2: 24 mmol/L (ref 20–29)
Chloride: 103 mmol/L (ref 96–106)
Creatinine, Ser: 0.76 mg/dL (ref 0.57–1.00)
GFR, EST AFRICAN AMERICAN: 108 mL/min/{1.73_m2} (ref >59)
GFR, EST NON AFRICAN AMERICAN: 94 mL/min/{1.73_m2} (ref >59)
GLUCOSE: 92 mg/dL (ref 65–99)
Globulin, Total: 2.2 g/dL (ref 1.5–4.5)
Potassium: 4.7 mmol/L (ref 3.5–5.2)
Sodium: 142 mmol/L (ref 134–144)
TOTAL PROTEIN: 6.2 g/dL (ref 6.0–8.5)

## 2017-03-08 LAB — CBC WITH DIFFERENTIAL/PLATELET
BASOS: 0 %
Basophils Absolute: 0 10*3/uL (ref 0.0–0.2)
EOS (ABSOLUTE): 0.2 10*3/uL (ref 0.0–0.4)
EOS: 3 %
HEMATOCRIT: 43.4 % (ref 34.0–46.6)
HEMOGLOBIN: 14.3 g/dL (ref 11.1–15.9)
IMMATURE GRANS (ABS): 0 10*3/uL (ref 0.0–0.1)
IMMATURE GRANULOCYTES: 0 %
Lymphocytes Absolute: 2.6 10*3/uL (ref 0.7–3.1)
Lymphs: 36 %
MCH: 30.8 pg (ref 26.6–33.0)
MCHC: 32.9 g/dL (ref 31.5–35.7)
MCV: 94 fL (ref 79–97)
MONOCYTES: 8 %
MONOS ABS: 0.6 10*3/uL (ref 0.1–0.9)
NEUTROS PCT: 53 %
Neutrophils Absolute: 3.7 10*3/uL (ref 1.4–7.0)
Platelets: 391 10*3/uL — ABNORMAL HIGH (ref 150–379)
RBC: 4.64 x10E6/uL (ref 3.77–5.28)
RDW: 13.2 % (ref 12.3–15.4)
WBC: 7.1 10*3/uL (ref 3.4–10.8)

## 2017-03-22 ENCOUNTER — Other Ambulatory Visit: Payer: Self-pay | Admitting: Family

## 2017-03-22 DIAGNOSIS — E041 Nontoxic single thyroid nodule: Secondary | ICD-10-CM

## 2017-04-20 ENCOUNTER — Other Ambulatory Visit: Payer: Self-pay | Admitting: Family

## 2017-04-20 DIAGNOSIS — F411 Generalized anxiety disorder: Secondary | ICD-10-CM

## 2017-04-29 ENCOUNTER — Telehealth: Payer: Self-pay | Admitting: Family

## 2017-04-29 DIAGNOSIS — F902 Attention-deficit hyperactivity disorder, combined type: Secondary | ICD-10-CM

## 2017-04-29 MED ORDER — LISDEXAMFETAMINE DIMESYLATE 70 MG PO CAPS: 70.0000 mg | ORAL_CAPSULE | Freq: Every day | ORAL | 0 refills | Status: DC

## 2017-04-29 NOTE — Telephone Encounter (Signed)
What is the name of the medication? Jordan [name]  Have you contacted your pharmacy to request a refill? yes  Which pharmacy would you like this sent to? [facility] [place], she has appt on 05/01/17 with [facility] for med refill wants to know if she can go ahead and get it refilled   Patient notified that their request is being sent to the clinical staff for review and that they should receive a call once it is complete. If they do not receive a call within 24 hours they can check with their pharmacy or our office.

## 2017-04-29 NOTE — Telephone Encounter (Signed)
Lm that rx was sent to pharmacy but to make sure she keeps appointment.

## 2017-04-29 NOTE — Telephone Encounter (Signed)
Vyvanse Prescription sent to pharmacy  ? ?

## 2017-05-01 ENCOUNTER — Encounter: Payer: Self-pay | Admitting: Family

## 2017-05-01 ENCOUNTER — Ambulatory Visit: Payer: BC Managed Care – PPO | Admitting: Family

## 2017-05-01 VITALS — BP 113/72 | HR 91 | Temp 98.2°F | Ht 66.0 in | Wt 192.0 lb

## 2017-05-01 DIAGNOSIS — M0579 Rheumatoid arthritis with rheumatoid factor of multiple sites without organ or systems involvement: Secondary | ICD-10-CM | POA: Diagnosis not present

## 2017-05-01 DIAGNOSIS — Z713 Dietary counseling and surveillance: Secondary | ICD-10-CM | POA: Diagnosis not present

## 2017-05-01 DIAGNOSIS — F902 Attention-deficit hyperactivity disorder, combined type: Secondary | ICD-10-CM | POA: Diagnosis not present

## 2017-05-01 DIAGNOSIS — F17200 Nicotine dependence, unspecified, uncomplicated: Secondary | ICD-10-CM

## 2017-05-01 DIAGNOSIS — E890 Postprocedural hypothyroidism: Secondary | ICD-10-CM

## 2017-05-01 DIAGNOSIS — E669 Obesity, unspecified: Secondary | ICD-10-CM

## 2017-05-01 MED ORDER — LISDEXAMFETAMINE DIMESYLATE 70 MG PO CAPS: 70.0000 mg | ORAL_CAPSULE | Freq: Every day | ORAL | 0 refills | Status: DC

## 2017-05-01 MED ORDER — PHENTERMINE HCL 37.5 MG PO TABS: 37.5000 mg | ORAL_TABLET | Freq: Every day | ORAL | 2 refills | Status: DC

## 2017-05-01 NOTE — Progress Notes (Signed)
   Subjective:    Patient ID: Jordan [name], female    DOB: 1971-02-02, 47 y.o.   MRN: [id number]  Pt presents to the office today for chronic follow up. PT is seeing Rheumatologists, but has not seen them recently. PT states she has gained 15 pounds over the last two months. Pt states she has tried been eating healthy, but can not lose.  Thyroid Problem  Presents for follow-up visit. Patient reports no constipation, diarrhea, dry skin, fatigue or visual change.  Arthritis  Presents for follow-up visit. She complains of pain and stiffness. Her pain is at a severity of 3/10. Pertinent negatives include no diarrhea or fatigue.  ADHD Pt currently taking Vyvanse 70 mg daily. States she went a few days without this and states she could not stay focus at work. States since she restarted this she has been able to stay focus at work.     Review of Systems  Constitutional: Negative for fatigue.  Gastrointestinal: Negative for constipation and diarrhea.  Musculoskeletal: Positive for arthralgias, arthritis and stiffness.  All other systems reviewed and are negative.      Objective:   Physical Exam  Constitutional: She is oriented to person, place, and time. She appears well-developed and well-nourished. No distress.  HENT:  Head: Normocephalic and atraumatic.  Eyes: Pupils are equal, round, and reactive to light.  Neck: Normal range of motion. Neck supple. No thyromegaly present.  Cardiovascular: Normal rate, regular rhythm, normal heart sounds and intact distal pulses.  No murmur heard. Pulmonary/Chest: Effort normal and breath sounds normal. No respiratory distress. She has no wheezes.  Abdominal: Soft. Bowel sounds are normal. She exhibits no distension. There is no tenderness.  Musculoskeletal: Normal range of motion. She exhibits no edema or tenderness.  Neurological: She is alert and oriented to person, place, and time.  Skin: Skin is warm and dry.  Psychiatric: She has a normal  mood and affect. Her behavior is normal. Judgment and thought content normal.  Vitals reviewed.     BP 113/72   Pulse 91   Temp 98.2 F (36.8 C) (Oral)   Ht '5\' 6"'$  (1.676 m)   Wt 192 lb (87.1 kg)   BMI 30.99 kg/m      Assessment & Plan:  1. ADHD (attention deficit hyperactivity disorder), combined type Meds as prescribed Behavior modification as needed Follow-up for recheck in 3 months - CMP14+EGFR - lisdexamfetamine (VYVANSE) 70 MG capsule; Take 1 capsule (70 mg total) by mouth daily.  Dispense: 30 capsule; Refill: 0 - lisdexamfetamine (VYVANSE) 70 MG capsule; Take 1 capsule (70 mg total) by mouth daily.  Dispense: 30 capsule; Refill: 0  2. Postoperative hypothyroidism - CMP14+EGFR  3. Current smoker - CMP14+EGFR  4. Rheumatoid arthritis involving multiple sites with positive rheumatoid factor (HCC) - CMP14+EGFR  5. Weight loss counseling, encounter for Encouraged healthy diet and exercise Must lost 5% of weight to continue medication  RTO in 3 months  - CMP14+EGFR - phentermine (ADIPEX-P) 37.5 MG tablet; Take 1 tablet (37.5 mg total) by mouth daily before breakfast.  Dispense: 30 tablet; Refill: 2  6. Obesity (BMI 30-39.9) - CMP14+EGFR - phentermine (ADIPEX-P) 37.5 MG tablet; Take 1 tablet (37.5 mg total) by mouth daily before breakfast.  Dispense: 30 tablet; Refill: [place], FNP

## 2017-05-01 NOTE — Patient Instructions (Signed)
Exercising to Lose Weight Exercising can help you to lose weight. In order to lose weight through exercise, you need to do vigorous-intensity exercise. You can tell that you are exercising with vigorous intensity if you are breathing very hard and fast and cannot hold a conversation while exercising. Moderate-intensity exercise helps to maintain your current weight. You can tell that you are exercising at a moderate level if you have a higher heart rate and faster breathing, but you are still able to hold a conversation. How often should I exercise? Choose an activity that you enjoy and set realistic goals. Your health care provider can help you to make an activity plan that works for you. Exercise regularly as directed by your health care provider. This may include:  Doing resistance training twice each week, such as: ? Push-ups. ? Sit-ups. ? Lifting weights. ? Using resistance bands.  Doing a given intensity of exercise for a given amount of time. Choose from these options: ? 150 minutes of moderate-intensity exercise every week. ? 75 minutes of vigorous-intensity exercise every week. ? A mix of moderate-intensity and vigorous-intensity exercise every week.  Children, pregnant women, people who are out of shape, people who are overweight, and older adults may need to consult a health care provider for individual recommendations. If you have any sort of medical condition, be sure to consult your health care provider before starting a new exercise program. What are some activities that can help me to lose weight?  Walking at a rate of at least 4.5 miles an hour.  Jogging or running at a rate of 5 miles per hour.  Biking at a rate of at least 10 miles per hour.  Lap swimming.  Roller-skating or in-line skating.  Cross-country skiing.  Vigorous competitive sports, such as football, basketball, and soccer.  Jumping rope.  Aerobic dancing. How can I be more active in my day-to-day  activities?  Use the stairs instead of the elevator.  Take a walk during your lunch break.  If you drive, park your car farther away from work or school.  If you take public transportation, get off one stop early and walk the rest of the way.  Make all of your phone calls while standing up and walking around.  Get up, stretch, and walk around every 30 minutes throughout the day. What guidelines should I follow while exercising?  Do not exercise so much that you hurt yourself, feel dizzy, or get very short of breath.  Consult your health care provider prior to starting a new exercise program.  Wear comfortable clothes and shoes with good support.  Drink plenty of water while you exercise to prevent dehydration or heat stroke. Body water is lost during exercise and must be replaced.  Work out until you breathe faster and your heart beats faster. This information is not intended to replace advice given to you by your health care provider. Make sure you discuss any questions you have with your health care provider. Document Released: 05/12/2010 Document Revised: 09/15/2015 Document Reviewed: 09/10/2013 Elsevier Interactive Patient Education  2018 Elsevier Inc.  

## 2017-05-02 LAB — CMP14+EGFR
ALBUMIN: 4.5 g/dL (ref 3.5–5.5)
ALK PHOS: 83 IU/L (ref 39–117)
ALT: 13 IU/L (ref 0–32)
AST: 16 IU/L (ref 0–40)
Albumin/Globulin Ratio: 2 (ref 1.2–2.2)
BUN / CREAT RATIO: 13 (ref 9–23)
BUN: 8 mg/dL (ref 6–24)
Bilirubin Total: 0.3 mg/dL (ref 0.0–1.2)
CO2: 24 mmol/L (ref 20–29)
CREATININE: 0.64 mg/dL (ref 0.57–1.00)
Calcium: 9.9 mg/dL (ref 8.7–10.2)
Chloride: 100 mmol/L (ref 96–106)
GFR calc Af Amer: 122 mL/min/{1.73_m2} (ref >59)
GFR calc non Af Amer: 106 mL/min/{1.73_m2} (ref >59)
GLUCOSE: 92 mg/dL (ref 65–99)
Globulin, Total: 2.2 g/dL (ref 1.5–4.5)
Potassium: 4.7 mmol/L (ref 3.5–5.2)
Sodium: 137 mmol/L (ref 134–144)
Total Protein: 6.7 g/dL (ref 6.0–8.5)

## 2017-06-20 ENCOUNTER — Telehealth: Payer: Self-pay | Admitting: Family

## 2017-06-20 MED ORDER — FLUCONAZOLE 150 MG PO TABS: 150.0000 mg | ORAL_TABLET | ORAL | 0 refills | Status: DC | PRN

## 2017-06-20 NOTE — Telephone Encounter (Signed)
What symptoms do you have? Burning and itching. Just finished Amoxocillan and needs Diflucan #2  How long have you been sick? 2 days  Have you been seen for this problem? NO  If your provider decides to give you a prescription, which pharmacy would you [facility] be [facility] [place]   Patient informed that this information will be sent to the clinical staff for review and that they should receive a follow up call.

## 2017-06-20 NOTE — Telephone Encounter (Signed)
Prescription sent to pharmacy.

## 2017-07-25 ENCOUNTER — Other Ambulatory Visit: Payer: Self-pay | Admitting: Family

## 2017-07-25 DIAGNOSIS — F411 Generalized anxiety disorder: Secondary | ICD-10-CM

## 2017-08-16 ENCOUNTER — Other Ambulatory Visit: Payer: Self-pay | Admitting: Family

## 2017-08-16 DIAGNOSIS — F411 Generalized anxiety disorder: Secondary | ICD-10-CM

## 2017-09-02 ENCOUNTER — Encounter: Payer: Self-pay | Admitting: Family

## 2017-09-02 ENCOUNTER — Ambulatory Visit: Payer: BC Managed Care – PPO | Admitting: Family

## 2017-09-02 VITALS — BP 123/75 | HR 82 | Temp 97.0°F | Ht 66.0 in | Wt 197.2 lb

## 2017-09-02 DIAGNOSIS — M0579 Rheumatoid arthritis with rheumatoid factor of multiple sites without organ or systems involvement: Secondary | ICD-10-CM | POA: Diagnosis not present

## 2017-09-02 DIAGNOSIS — F17200 Nicotine dependence, unspecified, uncomplicated: Secondary | ICD-10-CM | POA: Diagnosis not present

## 2017-09-02 DIAGNOSIS — E890 Postprocedural hypothyroidism: Secondary | ICD-10-CM | POA: Diagnosis not present

## 2017-09-02 DIAGNOSIS — E669 Obesity, unspecified: Secondary | ICD-10-CM | POA: Diagnosis not present

## 2017-09-02 DIAGNOSIS — F411 Generalized anxiety disorder: Secondary | ICD-10-CM | POA: Diagnosis not present

## 2017-09-02 DIAGNOSIS — F902 Attention-deficit hyperactivity disorder, combined type: Secondary | ICD-10-CM | POA: Diagnosis not present

## 2017-09-02 DIAGNOSIS — E041 Nontoxic single thyroid nodule: Secondary | ICD-10-CM

## 2017-09-02 MED ORDER — LISDEXAMFETAMINE DIMESYLATE 70 MG PO CAPS
70.0000 mg | ORAL_CAPSULE | Freq: Every day | ORAL | 0 refills | Status: DC
Start: 2017-09-02 — End: 2018-01-15

## 2017-09-02 MED ORDER — ARMOUR THYROID 60 MG PO TABS: 60.0000 mg | ORAL_TABLET | Freq: Every day | ORAL | 3 refills | Status: DC

## 2017-09-02 MED ORDER — LISDEXAMFETAMINE DIMESYLATE 70 MG PO CAPS: 70.0000 mg | ORAL_CAPSULE | Freq: Every day | ORAL | 0 refills | Status: DC

## 2017-09-02 MED ORDER — THYROID 90 MG PO TABS: 90.0000 mg | ORAL_TABLET | Freq: Every day | ORAL | 1 refills | Status: DC

## 2017-09-02 MED ORDER — BUPROPION HCL ER (XL) 300 MG PO TB24: 300.0000 mg | ORAL_TABLET | Freq: Every day | ORAL | 0 refills | Status: DC

## 2017-09-02 NOTE — Progress Notes (Signed)
Subjective:    Patient ID: Jordan [name], female    DOB: [birth year], 47 y.o.   MRN: [id number]  Chief Complaint  Patient presents with  . Medical Management of Chronic Issues    refills   PT presents to the office today for chronic follow up. She has Rheumatoid Arthritis, but states it has been over a year since her last visit. States she plans on seeing them this summer when things slows down.   PT has lost 5 lb from her office visit. States she had lost more than that, but has put several pounds on over the last few weeks because of stress.   Thyroid Problem  Presents for follow-up visit. Symptoms include anxiety, fatigue and weight gain. Patient reports no depressed mood or diarrhea. The symptoms have been worsening.  Arthritis  Presents for follow-up visit. She complains of pain and stiffness. Affected locations include the right MCP and left MCP. Her pain is at a severity of 4/10. Associated symptoms include fatigue. Pertinent negatives include no diarrhea.  Anxiety  Presents for follow-up visit. Symptoms include excessive worry, irritability, nervous/anxious behavior and restlessness. Patient reports no chest pain or depressed mood. Symptoms occur occasionally. The severity of symptoms is moderate. The quality of sleep is good.    ADHD Pt currently taking Vyvanse 70 mg daily. States she went a few days without this and states she could not stay focus at work.        Review of Systems  Constitutional: Positive for fatigue, irritability and weight gain.  Cardiovascular: Negative for chest pain.  Gastrointestinal: Negative for diarrhea.  Musculoskeletal: Positive for arthritis and stiffness.  Psychiatric/Behavioral: The patient is nervous/anxious.   All other systems reviewed and are negative.      Objective:   Physical Exam  Constitutional: She is oriented to person, place, and time. She appears well-developed and well-nourished. No distress.  HENT:  Head:  Normocephalic and atraumatic.  Right Ear: External ear normal.  Left Ear: External ear normal.  Mouth/Throat: Oropharynx is clear and moist.  Eyes: Pupils are equal, round, and reactive to light.  Neck: Normal range of motion. Neck supple. No thyromegaly present.  Cardiovascular: Normal rate, regular rhythm, normal heart sounds and intact distal pulses.  No murmur heard. Pulmonary/Chest: Effort normal and breath sounds normal. No respiratory distress. She has no wheezes.  Abdominal: Soft. Bowel sounds are normal. She exhibits no distension. There is no tenderness.  Musculoskeletal: Normal range of motion. She exhibits edema (trace BLE). She exhibits no tenderness.  Neurological: She is alert and oriented to person, place, and time. She has normal reflexes. No cranial nerve deficit.  Skin: Skin is warm and dry.  Psychiatric: She has a normal mood and affect. Her behavior is normal. Judgment and thought content normal.  Vitals reviewed.     BP 123/75   Pulse 82   Temp (!) 97 F (36.1 C) (Oral)   Ht _0  (1.676 m)   Wt 197 lb 3.2 oz (89.4 kg)   BMI 31.83 kg/m      Assessment & Plan:  Jordan [name] comes in today with chief complaint of Medical Management of Chronic Issues (refills)   Diagnosis and orders addressed:  1. Postoperative hypothyroidism - CMP14+EGFR - TSH - thyroid (ARMOUR) 90 MG tablet; Take 1 tablet (90 mg total) by mouth daily.  Dispense: 90 tablet; Refill: 1 - ARMOUR THYROID 60 MG tablet; Take 1 tablet (60 mg total) by mouth daily before breakfast.  Dispense: 90 tablet; Refill: 3  2. Rheumatoid arthritis involving multiple sites with positive rheumatoid factor (HCC) - CMP14+EGFR  3. ADHD (attention deficit hyperactivity disorder), combined type Meds as prescribed Behavior modification as needed Follow-up for recheck in 3 months - CMP14+EGFR - lisdexamfetamine (VYVANSE) 70 MG capsule; Take 1 capsule (70 mg total) by mouth daily.  Dispense: 30 capsule;  Refill: 0 - lisdexamfetamine (VYVANSE) 70 MG capsule; Take 1 capsule (70 mg total) by mouth daily.  Dispense: 30 capsule; Refill: 0 - lisdexamfetamine (VYVANSE) 70 MG capsule; Take 1 capsule (70 mg total) by mouth daily.  Dispense: 30 capsule; Refill: 0  4. Current smoker - CMP14+EGFR  5. Obesity (BMI 30-39.9) - CMP14+EGFR  6. Thyroid nodule, uninodular - CMP14+EGFR - ARMOUR THYROID 60 MG tablet; Take 1 tablet (60 mg total) by mouth daily before breakfast.  Dispense: 90 tablet; Refill: 3  7. GAD (generalized anxiety disorder) - CMP14+EGFR - buPROPion (WELLBUTRIN XL) 300 MG 24 hr tablet; Take 1 tablet (300 mg total) by mouth daily.  Dispense: 90 tablet; Refill: 0   Labs pending Health Maintenance reviewed Diet and exercise encouraged  Follow up plan: 3 months    Jordan Dun, FNP

## 2017-09-02 NOTE — Patient Instructions (Signed)
Hypothyroidism Hypothyroidism is a disorder of the thyroid. The thyroid is a large gland that is located in the lower front of the neck. The thyroid releases hormones that control how the body works. With hypothyroidism, the thyroid does not make enough of these hormones. What are the causes? Causes of hypothyroidism may include:  Viral infections.  Pregnancy.  Your own defense system (immune system) attacking your thyroid.  Certain medicines.  Birth defects.  Past radiation treatments to your head or neck.  Past treatment with radioactive iodine.  Past surgical removal of part or all of your thyroid.  Problems with the gland that is located in the center of your brain (pituitary).  What are the signs or symptoms? Signs and symptoms of hypothyroidism may include:  Feeling as though you have no energy (lethargy).  Inability to tolerate cold.  Weight gain that is not explained by a change in diet or exercise habits.  Dry skin.  Coarse hair.  Menstrual irregularity.  Slowing of thought processes.  Constipation.  Sadness or depression.  How is this diagnosed? Your health care provider may diagnose hypothyroidism with blood tests and ultrasound tests. How is this treated? Hypothyroidism is treated with medicine that replaces the hormones that your body does not make. After you begin treatment, it may take several weeks for symptoms to go away. Follow these instructions at home:  Take medicines only as directed by your health care provider.  If you start taking any new medicines, tell your health care provider.  Keep all follow-up visits as directed by your health care provider. This is important. As your condition improves, your dosage needs may change. You will need to have blood tests regularly so that your health care provider can watch your condition. Contact a health care provider if:  Your symptoms do not get better with treatment.  You are taking thyroid  replacement medicine and: ? You sweat excessively. ? You have tremors. ? You feel anxious. ? You lose weight rapidly. ? You cannot tolerate heat. ? You have emotional swings. ? You have diarrhea. ? You feel weak. Get help right away if:  You develop chest pain.  You develop an irregular heartbeat.  You develop a rapid heartbeat. This information is not intended to replace advice given to you by your health care provider. Make sure you discuss any questions you have with your health care provider. Document Released: 04/09/2005 Document Revised: 09/15/2015 Document Reviewed: 08/25/2013 Elsevier Interactive Patient Education  2018 Elsevier Inc.  

## 2017-09-03 ENCOUNTER — Other Ambulatory Visit: Payer: Self-pay | Admitting: Family

## 2017-09-03 DIAGNOSIS — E039 Hypothyroidism, unspecified: Secondary | ICD-10-CM

## 2017-09-03 LAB — CMP14+EGFR
ALK PHOS: 82 IU/L (ref 39–117)
ALT: 10 IU/L (ref 0–32)
AST: 12 IU/L (ref 0–40)
Albumin/Globulin Ratio: 2.3 — ABNORMAL HIGH (ref 1.2–2.2)
Albumin: 4.2 g/dL (ref 3.5–5.5)
BUN/Creatinine Ratio: 7 — ABNORMAL LOW (ref 9–23)
BUN: 5 mg/dL — AB (ref 6–24)
Bilirubin Total: 0.3 mg/dL (ref 0.0–1.2)
CO2: 22 mmol/L (ref 20–29)
CREATININE: 0.69 mg/dL (ref 0.57–1.00)
Calcium: 9.2 mg/dL (ref 8.7–10.2)
Chloride: 101 mmol/L (ref 96–106)
GFR calc Af Amer: 119 mL/min/{1.73_m2} (ref >59)
GFR calc non Af Amer: 103 mL/min/{1.73_m2} (ref >59)
GLUCOSE: 86 mg/dL (ref 65–99)
Globulin, Total: 1.8 g/dL (ref 1.5–4.5)
Potassium: 4.1 mmol/L (ref 3.5–5.2)
SODIUM: 137 mmol/L (ref 134–144)
Total Protein: 6 g/dL (ref 6.0–8.5)

## 2017-09-03 LAB — TSH

## 2017-10-06 ENCOUNTER — Other Ambulatory Visit: Payer: Self-pay | Admitting: Family

## 2017-10-06 DIAGNOSIS — E041 Nontoxic single thyroid nodule: Secondary | ICD-10-CM

## 2017-10-08 ENCOUNTER — Ambulatory Visit: Payer: BC Managed Care – PPO | Admitting: "Endocrinology

## 2017-10-24 ENCOUNTER — Other Ambulatory Visit: Payer: Self-pay | Admitting: Family

## 2017-10-24 DIAGNOSIS — F411 Generalized anxiety disorder: Secondary | ICD-10-CM

## 2017-11-01 ENCOUNTER — Other Ambulatory Visit: Payer: Self-pay | Admitting: Family

## 2017-11-01 DIAGNOSIS — F411 Generalized anxiety disorder: Secondary | ICD-10-CM

## 2017-11-28 ENCOUNTER — Other Ambulatory Visit: Payer: Self-pay | Admitting: Family

## 2017-11-28 DIAGNOSIS — F411 Generalized anxiety disorder: Secondary | ICD-10-CM

## 2017-12-16 ENCOUNTER — Telehealth: Payer: Self-pay | Admitting: *Deleted

## 2017-12-16 DIAGNOSIS — F902 Attention-deficit hyperactivity disorder, combined type: Secondary | ICD-10-CM

## 2017-12-16 MED ORDER — LISDEXAMFETAMINE DIMESYLATE 70 MG PO CAPS: 70.0000 mg | ORAL_CAPSULE | Freq: Every day | ORAL | 0 refills | Status: DC

## 2017-12-16 NOTE — Telephone Encounter (Signed)
Prescription sent to pharmacy.

## 2018-01-15 ENCOUNTER — Encounter: Payer: Self-pay | Admitting: Family

## 2018-01-15 ENCOUNTER — Ambulatory Visit: Payer: BC Managed Care – PPO | Admitting: Family

## 2018-01-15 VITALS — BP 124/78 | HR 81 | Temp 98.5°F | Ht 66.0 in | Wt 194.4 lb

## 2018-01-15 DIAGNOSIS — J029 Acute pharyngitis, unspecified: Secondary | ICD-10-CM | POA: Diagnosis not present

## 2018-01-15 DIAGNOSIS — F411 Generalized anxiety disorder: Secondary | ICD-10-CM

## 2018-01-15 DIAGNOSIS — F902 Attention-deficit hyperactivity disorder, combined type: Secondary | ICD-10-CM

## 2018-01-15 DIAGNOSIS — E890 Postprocedural hypothyroidism: Secondary | ICD-10-CM

## 2018-01-15 DIAGNOSIS — J069 Acute upper respiratory infection, unspecified: Secondary | ICD-10-CM

## 2018-01-15 DIAGNOSIS — F1129 Opioid dependence with unspecified opioid-induced disorder: Secondary | ICD-10-CM

## 2018-01-15 DIAGNOSIS — F1120 Opioid dependence, uncomplicated: Secondary | ICD-10-CM | POA: Insufficient documentation

## 2018-01-15 DIAGNOSIS — Z79899 Other long term (current) drug therapy: Secondary | ICD-10-CM | POA: Diagnosis not present

## 2018-01-15 LAB — CULTURE, GROUP A STREP

## 2018-01-15 LAB — RAPID STREP SCREEN (MED CTR MEBANE ONLY): Strep Gp A Ag, IA W/Reflex: NEGATIVE

## 2018-01-15 MED ORDER — LISDEXAMFETAMINE DIMESYLATE 70 MG PO CAPS: 70.0000 mg | ORAL_CAPSULE | Freq: Every day | ORAL | 0 refills | Status: DC

## 2018-01-15 MED ORDER — ALPRAZOLAM 0.5 MG PO TABS: 0.5000 mg | ORAL_TABLET | Freq: Every evening | ORAL | 4 refills | Status: DC | PRN

## 2018-01-15 MED ORDER — FLUTICASONE PROPIONATE 50 MCG/ACT NA SUSP: 2.0000 | Freq: Every day | NASAL | 6 refills | Status: DC

## 2018-01-15 NOTE — Patient Instructions (Signed)
Hypothyroidism Hypothyroidism is a disorder of the thyroid. The thyroid is a large gland that is located in the lower front of the neck. The thyroid releases hormones that control how the body works. With hypothyroidism, the thyroid does not make enough of these hormones. What are the causes? Causes of hypothyroidism may include:  Viral infections.  Pregnancy.  Your own defense system (immune system) attacking your thyroid.  Certain medicines.  Birth defects.  Past radiation treatments to your head or neck.  Past treatment with radioactive iodine.  Past surgical removal of part or all of your thyroid.  Problems with the gland that is located in the center of your brain (pituitary).  What are the signs or symptoms? Signs and symptoms of hypothyroidism may include:  Feeling as though you have no energy (lethargy).  Inability to tolerate cold.  Weight gain that is not explained by a change in diet or exercise habits.  Dry skin.  Coarse hair.  Menstrual irregularity.  Slowing of thought processes.  Constipation.  Sadness or depression.  How is this diagnosed? Your health care provider may diagnose hypothyroidism with blood tests and ultrasound tests. How is this treated? Hypothyroidism is treated with medicine that replaces the hormones that your body does not make. After you begin treatment, it may take several weeks for symptoms to go away. Follow these instructions at home:  Take medicines only as directed by your health care provider.  If you start taking any new medicines, tell your health care provider.  Keep all follow-up visits as directed by your health care provider. This is important. As your condition improves, your dosage needs may change. You will need to have blood tests regularly so that your health care provider can watch your condition. Contact a health care provider if:  Your symptoms do not get better with treatment.  You are taking thyroid  replacement medicine and: ? You sweat excessively. ? You have tremors. ? You feel anxious. ? You lose weight rapidly. ? You cannot tolerate heat. ? You have emotional swings. ? You have diarrhea. ? You feel weak. Get help right away if:  You develop chest pain.  You develop an irregular heartbeat.  You develop a rapid heartbeat. This information is not intended to replace advice given to you by your health care provider. Make sure you discuss any questions you have with your health care provider. Document Released: 04/09/2005 Document Revised: 09/15/2015 Document Reviewed: 08/25/2013 Elsevier Interactive Patient Education  2018 Elsevier Inc.  

## 2018-01-15 NOTE — Progress Notes (Signed)
Subjective:    Patient ID: Jordan [name], female    DOB: [birth year], 47 y.o.   MRN: [id number]  Chief Complaint  Patient presents with  . Dizziness  . Wheezing  . Headache    PT presents to the office today for chronic follow up. She has Rheumatoid Arthritis, but states it has been over a year since her last visit.   Sore Throat   This is a new problem. The current episode started yesterday. The problem has been unchanged. The pain is at a severity of 5/10. The pain is moderate. Associated symptoms include congestion, coughing (dry), ear pain, headaches, a hoarse voice, a plugged ear sensation and trouble swallowing. Associated symptoms comments: Chills . She has tried acetaminophen for the symptoms. The treatment provided mild relief.  Thyroid Problem  Presents for follow-up visit. Symptoms include anxiety, constipation, depressed mood, fatigue and hoarse voice. The symptoms have been stable.  Anxiety  Presents for follow-up visit. Symptoms include depressed mood, excessive worry, irritability, nervous/anxious behavior and restlessness. Symptoms occur most days. The severity of symptoms is moderate.    Depression         This is a chronic problem.  The current episode started more than 1 year ago.   The onset quality is gradual.   The problem occurs intermittently.  The problem has been waxing and waning since onset.  Associated symptoms include fatigue, restlessness, headaches and sad.  Past treatments include SSRIs - Selective serotonin reuptake inhibitors.  Past medical history includes thyroid problem and anxiety.   ADHD Pt currently taking Vyvanse 70 mg daily. States this helps her stay focused at work.    Review of Systems  Constitutional: Positive for fatigue and irritability.  HENT: Positive for congestion, ear pain, hoarse voice and trouble swallowing.   Respiratory: Positive for cough (dry).   Gastrointestinal: Positive for constipation.  Neurological: Positive for  headaches.  Psychiatric/Behavioral: Positive for depression. The patient is nervous/anxious.   All other systems reviewed and are negative.      Objective:   Physical Exam  Constitutional: She is oriented to person, place, and time. She appears well-developed and well-nourished. No distress.  HENT:  Head: Normocephalic and atraumatic.  Right Ear: External ear normal.  Left Ear: Tympanic membrane is bulging.  Nose: Mucosal edema present.  Mouth/Throat: Posterior oropharyngeal erythema present.  Eyes: Pupils are equal, round, and reactive to light.  Neck: Normal range of motion. Neck supple. No thyromegaly present.  Cardiovascular: Normal rate, regular rhythm, normal heart sounds and intact distal pulses.  No murmur heard. Pulmonary/Chest: Effort normal and breath sounds normal. No respiratory distress. She has no wheezes.  Abdominal: Soft. Bowel sounds are normal. She exhibits no distension. There is no tenderness.  Musculoskeletal: Normal range of motion. She exhibits no edema or tenderness.  Neurological: She is alert and oriented to person, place, and time. She has normal reflexes. No cranial nerve deficit.  Skin: Skin is warm and dry.  Psychiatric: She has a normal mood and affect. Her behavior is normal. Judgment and thought content normal.  Vitals reviewed.     BP 124/78   Pulse 81   Temp 98.5 F (36.9 C) (Oral)   Ht 5\' 6"  (1.676 m)   Wt 194 lb 6.4 oz (88.2 kg)   BMI 31.38 kg/m      Assessment & Plan:  [name] comes in today with chief complaint of Dizziness; Wheezing; and Headache   Diagnosis and orders addressed:  1.  Sore throat - Rapid Strep Screen ([facility] ONLY)  2. Postoperative hypothyroidism - TSH  3. Viral upper respiratory tract infection - fluticasone (FLONASE) 50 MCG/ACT nasal spray; Place 2 sprays into both nostrils daily.  Dispense: 16 g; Refill: 6  4. Controlled substance agreement signed - ToxASSURE Select 13 (MW), Urine -  lisdexamfetamine (VYVANSE) 70 MG capsule; Take 1 capsule (70 mg total) by mouth daily.  Dispense: 30 capsule; Refill: 0 - lisdexamfetamine (VYVANSE) 70 MG capsule; Take 1 capsule (70 mg total) by mouth daily.  Dispense: 30 capsule; Refill: 0 - lisdexamfetamine (VYVANSE) 70 MG capsule; Take 1 capsule (70 mg total) by mouth daily.  Dispense: 30 capsule; Refill: 0 - ALPRAZolam (XANAX) 0.5 MG tablet; Take 1 tablet (0.5 mg total) by mouth at bedtime as needed for anxiety.  Dispense: 30 tablet; Refill: 4  5. Opioid dependence with opioid-induced disorder (HCC) - ToxASSURE Select 13 (MW), Urine - lisdexamfetamine (VYVANSE) 70 MG capsule; Take 1 capsule (70 mg total) by mouth daily.  Dispense: 30 capsule; Refill: 0 - lisdexamfetamine (VYVANSE) 70 MG capsule; Take 1 capsule (70 mg total) by mouth daily.  Dispense: 30 capsule; Refill: 0 - lisdexamfetamine (VYVANSE) 70 MG capsule; Take 1 capsule (70 mg total) by mouth daily.  Dispense: 30 capsule; Refill: 0 - ALPRAZolam (XANAX) 0.5 MG tablet; Take 1 tablet (0.5 mg total) by mouth at bedtime as needed for anxiety.  Dispense: 30 tablet; Refill: 4  6. ADHD (attention deficit hyperactivity disorder), combined type Meds as prescribed Behavior modification as needed Follow-up for recheck in 3 months - ToxASSURE Select 13 (MW), Urine - lisdexamfetamine (VYVANSE) 70 MG capsule; Take 1 capsule (70 mg total) by mouth daily.  Dispense: 30 capsule; Refill: 0 - lisdexamfetamine (VYVANSE) 70 MG capsule; Take 1 capsule (70 mg total) by mouth daily.  Dispense: 30 capsule; Refill: 0 - lisdexamfetamine (VYVANSE) 70 MG capsule; Take 1 capsule (70 mg total) by mouth daily.  Dispense: 30 capsule; Refill: 0  7. GAD (generalized anxiety disorder) - ToxASSURE Select 13 (MW), Urine - ALPRAZolam (XANAX) 0.5 MG tablet; Take 1 tablet (0.5 mg total) by mouth at bedtime as needed for anxiety.  Dispense: 30 tablet; Refill: 4   Labs pending Pt reviewed in [place] controlled database, Pt  had pain medication  Health Maintenance reviewed Diet and exercise encouraged  Follow up plan: 3 months    [name], FNP

## 2018-01-16 ENCOUNTER — Ambulatory Visit: Payer: BC Managed Care – PPO | Admitting: Family

## 2018-01-16 LAB — TSH: TSH: 0.138 u[IU]/mL — AB (ref 0.450–4.500)

## 2018-01-17 ENCOUNTER — Other Ambulatory Visit: Payer: Self-pay | Admitting: Family

## 2018-01-17 MED ORDER — THYROID 60 MG PO TABS: 60.0000 mg | ORAL_TABLET | Freq: Every day | ORAL | 1 refills | Status: DC

## 2018-01-21 LAB — TOXASSURE SELECT 13 (MW), URINE

## 2018-03-21 ENCOUNTER — Other Ambulatory Visit: Payer: Self-pay | Admitting: Family

## 2018-03-21 DIAGNOSIS — F411 Generalized anxiety disorder: Secondary | ICD-10-CM

## 2018-04-03 ENCOUNTER — Other Ambulatory Visit: Payer: Self-pay | Admitting: Family Medicine

## 2018-04-03 ENCOUNTER — Encounter: Payer: Self-pay | Admitting: Family Medicine

## 2018-04-03 ENCOUNTER — Ambulatory Visit: Payer: BC Managed Care – PPO | Admitting: Family Medicine

## 2018-04-03 VITALS — BP 115/75 | HR 82 | Temp 98.0°F | Ht 66.0 in | Wt 200.0 lb

## 2018-04-03 DIAGNOSIS — J0111 Acute recurrent frontal sinusitis: Secondary | ICD-10-CM

## 2018-04-03 DIAGNOSIS — R52 Pain, unspecified: Secondary | ICD-10-CM | POA: Diagnosis not present

## 2018-04-03 MED ORDER — AMOXICILLIN-POT CLAVULANATE 875-125 MG PO TABS: 1.0000 | ORAL_TABLET | Freq: Two times a day (BID) | ORAL | 0 refills | Status: AC

## 2018-04-03 NOTE — Patient Instructions (Signed)
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
## 2018-04-04 LAB — VERITOR FLU A/B WAIVED
INFLUENZA B: NEGATIVE
Influenza A: NEGATIVE

## 2018-04-14 ENCOUNTER — Other Ambulatory Visit: Payer: Self-pay | Admitting: Family

## 2018-04-14 NOTE — Telephone Encounter (Signed)
Please address

## 2018-04-14 NOTE — Telephone Encounter (Signed)
Pt aware and will call first thing Thursday morning when we open to see if Alyse Low has any openings.

## 2018-04-14 NOTE — Telephone Encounter (Signed)
Patient NTBS for follow up and lab work  

## 2018-04-17 ENCOUNTER — Encounter: Payer: Self-pay | Admitting: Family

## 2018-04-17 ENCOUNTER — Ambulatory Visit: Payer: BC Managed Care – PPO | Admitting: Family

## 2018-04-17 VITALS — BP 112/68 | HR 79 | Temp 97.5°F | Ht 66.0 in | Wt 201.6 lb

## 2018-04-17 DIAGNOSIS — F902 Attention-deficit hyperactivity disorder, combined type: Secondary | ICD-10-CM | POA: Diagnosis not present

## 2018-04-17 DIAGNOSIS — F17200 Nicotine dependence, unspecified, uncomplicated: Secondary | ICD-10-CM

## 2018-04-17 DIAGNOSIS — Z79899 Other long term (current) drug therapy: Secondary | ICD-10-CM | POA: Diagnosis not present

## 2018-04-17 DIAGNOSIS — F411 Generalized anxiety disorder: Secondary | ICD-10-CM

## 2018-04-17 DIAGNOSIS — F1129 Opioid dependence with unspecified opioid-induced disorder: Secondary | ICD-10-CM

## 2018-04-17 DIAGNOSIS — E669 Obesity, unspecified: Secondary | ICD-10-CM

## 2018-04-17 DIAGNOSIS — M0579 Rheumatoid arthritis with rheumatoid factor of multiple sites without organ or systems involvement: Secondary | ICD-10-CM

## 2018-04-17 DIAGNOSIS — E890 Postprocedural hypothyroidism: Secondary | ICD-10-CM

## 2018-04-17 MED ORDER — LISDEXAMFETAMINE DIMESYLATE 70 MG PO CAPS: 70.0000 mg | ORAL_CAPSULE | Freq: Every day | ORAL | 0 refills | Status: DC

## 2018-04-17 MED ORDER — BUPROPION HCL ER (XL) 300 MG PO TB24: 300.0000 mg | ORAL_TABLET | Freq: Every day | ORAL | 3 refills | Status: DC

## 2018-04-17 MED ORDER — ALPRAZOLAM 0.5 MG PO TABS: 0.5000 mg | ORAL_TABLET | Freq: Every evening | ORAL | 4 refills | Status: DC | PRN

## 2018-04-17 NOTE — Patient Instructions (Signed)
Hypothyroidism  Hypothyroidism is when the thyroid gland does not make enough of certain hormones (it is underactive). The thyroid gland is a small gland located in the lower front part of the neck, just in front of the windpipe (trachea). This gland makes hormones that help control how the body uses food for energy (metabolism) as well as how the heart and brain function. These hormones also play a role in keeping your bones strong. When the thyroid is underactive, it produces too little of the hormones thyroxine (T4) and triiodothyronine (T3). What are the causes? This condition may be caused by:  Hashimoto's disease. This is a disease in which the body's disease-fighting system (immune system) attacks the thyroid gland. This is the most common cause.  Viral infections.  Pregnancy.  Certain medicines.  Birth defects.  Past radiation treatments to the head or neck for cancer.  Past treatment with radioactive iodine.  Past exposure to radiation in the environment.  Past surgical removal of part or all of the thyroid.  Problems with a gland in the center of the brain (pituitary gland).  Lack of enough iodine in the diet. What increases the risk? You are more likely to develop this condition if:  You are female.  You have a family history of thyroid conditions.  You use a medicine called lithium.  You take medicines that affect the immune system (immunosuppressants). What are the signs or symptoms? Symptoms of this condition include:  Feeling as though you have no energy (lethargy).  Not being able to tolerate cold.  Weight gain that is not explained by a change in diet or exercise habits.  Lack of appetite.  Dry skin.  Coarse hair.  Menstrual irregularity.  Slowing of thought processes.  Constipation.  Sadness or depression. How is this diagnosed? This condition may be diagnosed based on:  Your symptoms, your medical history, and a physical exam.  Blood  tests. You may also have imaging tests, such as an ultrasound or MRI. How is this treated? This condition is treated with medicine that replaces the thyroid hormones that your body does not make. After you begin treatment, it may take several weeks for symptoms to go away. Follow these instructions at home:  Take over-the-counter and prescription medicines only as told by your health care provider.  If you start taking any new medicines, tell your health care provider.  Keep all follow-up visits as told by your health care provider. This is important. ? As your condition improves, your dosage of thyroid hormone medicine may change. ? You will need to have blood tests regularly so that your health care provider can monitor your condition. Contact a health care provider if:  Your symptoms do not get better with treatment.  You are taking thyroid replacement medicine and you: ? Sweat a lot. ? Have tremors. ? Feel anxious. ? Lose weight rapidly. ? Cannot tolerate heat. ? Have emotional swings. ? Have diarrhea. ? Feel weak. Get help right away if you have:  Chest pain.  An irregular heartbeat.  A rapid heartbeat.  Difficulty breathing. Summary  Hypothyroidism is when the thyroid gland does not make enough of certain hormones (it is underactive).  When the thyroid is underactive, it produces too little of the hormones thyroxine (T4) and triiodothyronine (T3).  The most common cause is Hashimoto's disease, a disease in which the body's disease-fighting system (immune system) attacks the thyroid gland. The condition can also be caused by viral infections, medicine, pregnancy, or past   radiation treatment to the head or neck.  Symptoms may include weight gain, dry skin, constipation, feeling as though you do not have energy, and not being able to tolerate cold.  This condition is treated with medicine to replace the thyroid hormones that your body does not make. This information  is not intended to replace advice given to you by your health care provider. Make sure you discuss any questions you have with your health care provider. Document Released: 04/09/2005 Document Revised: 03/20/2017 Document Reviewed: 03/20/2017 [employer]vier Interactive Patient Education  2019 [employer]  

## 2018-04-17 NOTE — Progress Notes (Signed)
Subjective:    Patient ID: Jordan [name], female    DOB: Oct 20, 1970, 47 y.o.   MRN: [id number]  Chief Complaint  Patient presents with  . Medical Management of Chronic Issues    three month recheck  . Hypothyroidism  . ADHD   Pt presents to the office today for chronic follow up. She has Rheumatoid Arthritis, but states it has been over a year since her last visit.  Thyroid Problem  Presents for follow-up visit. Symptoms include anxiety, constipation, fatigue and hair loss. Patient reports no depressed mood or dry skin. The symptoms have been worsening.  Anxiety  Presents for follow-up visit. Symptoms include excessive worry, insomnia, nervous/anxious behavior and panic. Patient reports no depressed mood. Symptoms occur occasionally. The severity of symptoms is moderate. The quality of sleep is good.    Arthritis  Presents for follow-up visit. She complains of pain and stiffness. The symptoms have been worsening. Affected locations include the right MCP, left MCP, right elbow, left elbow, left shoulder, right shoulder, right foot, left foot and neck. Her pain is at a severity of 5/10. Associated symptoms include fatigue.  Depression         This is a chronic problem.  The current episode started more than 1 year ago.   The onset quality is gradual.   The problem occurs intermittently.  Associated symptoms include fatigue, insomnia, irritable, decreased interest and sad.  Past medical history includes thyroid problem and anxiety.   ADHD Vyanse 70 mg daily. States this is working well.    Review of Systems  Constitutional: Positive for fatigue.  Gastrointestinal: Positive for constipation.  Musculoskeletal: Positive for arthritis and stiffness.  Psychiatric/Behavioral: Positive for depression. The patient is nervous/anxious and has insomnia.   All other systems reviewed and are negative.      Objective:   Physical Exam Vitals signs reviewed.  Constitutional:      [name]:  She is irritable. She is not in acute distress.    Appearance: She is well-developed.  HENT:     Head: Normocephalic and atraumatic.  Eyes:     Pupils: Pupils are equal, round, and reactive to light.  Neck:     Musculoskeletal: Normal range of motion and neck supple.     Thyroid: No thyromegaly.  Cardiovascular:     Rate and Rhythm: Normal rate and regular rhythm.     Heart sounds: Normal heart sounds. No murmur.  Pulmonary:     Effort: Pulmonary effort is normal. No respiratory distress.     Breath sounds: Normal breath sounds. No wheezing.  Abdominal:     [name]: Bowel sounds are normal. There is no distension.     Palpations: Abdomen is soft.     Tenderness: There is no abdominal tenderness.  Musculoskeletal: Normal range of motion.        [name]: Swelling (trace swelling in bilateral hand) and tenderness present.  Skin:    [name]: Skin is warm and dry.  Neurological:     Mental Status: She is alert and oriented to person, place, and time.     Cranial Nerves: No cranial nerve deficit.     Deep Tendon Reflexes: Reflexes are normal and symmetric.  Psychiatric:        Behavior: Behavior normal.        Thought Content: Thought content normal.        Judgment: Judgment normal.     BP 112/68   Pulse 79   Temp (!) 97.5 F (  36.4 C) (Oral)   Ht 5' 6" (1.676 m)   Wt 201 lb 9.6 oz (91.4 kg)   BMI 32.54 kg/m        Assessment & Plan:  Jordan [name] comes in today with chief complaint of Medical Management of Chronic Issues (three month recheck); Hypothyroidism; and ADHD   Diagnosis and orders addressed:  1. ADHD (attention deficit hyperactivity disorder), combined type Meds as prescribed Behavior modification as needed Follow-up for recheck in 3 months - CMP14+EGFR - CBC with Differential/Platelet - lisdexamfetamine (VYVANSE) 70 MG capsule; Take 1 capsule (70 mg total) by mouth daily.  Dispense: 30 capsule; Refill: 0 - lisdexamfetamine (VYVANSE) 70 MG capsule;  Take 1 capsule (70 mg total) by mouth daily.  Dispense: 30 capsule; Refill: 0 - lisdexamfetamine (VYVANSE) 70 MG capsule; Take 1 capsule (70 mg total) by mouth daily.  Dispense: 30 capsule; Refill: 0  2. Controlled substance agreement signed - CMP14+EGFR - CBC with Differential/Platelet - lisdexamfetamine (VYVANSE) 70 MG capsule; Take 1 capsule (70 mg total) by mouth daily.  Dispense: 30 capsule; Refill: 0 - lisdexamfetamine (VYVANSE) 70 MG capsule; Take 1 capsule (70 mg total) by mouth daily.  Dispense: 30 capsule; Refill: 0 - lisdexamfetamine (VYVANSE) 70 MG capsule; Take 1 capsule (70 mg total) by mouth daily.  Dispense: 30 capsule; Refill: 0 - ALPRAZolam (XANAX) 0.5 MG tablet; Take 1 tablet (0.5 mg total) by mouth at bedtime as needed for anxiety.  Dispense: 30 tablet; Refill: 4  3. Current smoker Smoking cessation discussed - CMP14+EGFR - CBC with Differential/Platelet  4. GAD (generalized anxiety disorder) - CMP14+EGFR - CBC with Differential/Platelet - buPROPion (WELLBUTRIN XL) 300 MG 24 hr tablet; Take 1 tablet (300 mg total) by mouth daily.  Dispense: 90 tablet; Refill: 3 - ALPRAZolam (XANAX) 0.5 MG tablet; Take 1 tablet (0.5 mg total) by mouth at bedtime as needed for anxiety.  Dispense: 30 tablet; Refill: 4  5. Postoperative hypothyroidism - CMP14+EGFR - TSH - CBC with Differential/Platelet  6. Obesity (BMI 30-39.9) - CMP14+EGFR - CBC with Differential/Platelet  7. Opioid dependence with opioid-induced disorder (HCC) - CMP14+EGFR - CBC with Differential/Platelet - lisdexamfetamine (VYVANSE) 70 MG capsule; Take 1 capsule (70 mg total) by mouth daily.  Dispense: 30 capsule; Refill: 0 - lisdexamfetamine (VYVANSE) 70 MG capsule; Take 1 capsule (70 mg total) by mouth daily.  Dispense: 30 capsule; Refill: 0 - lisdexamfetamine (VYVANSE) 70 MG capsule; Take 1 capsule (70 mg total) by mouth daily.  Dispense: 30 capsule; Refill: 0 - ALPRAZolam (XANAX) 0.5 MG tablet; Take 1  tablet (0.5 mg total) by mouth at bedtime as needed for anxiety.  Dispense: 30 tablet; Refill: 4  8. Rheumatoid arthritis involving multiple sites with positive rheumatoid factor ([place]) - CMP14+EGFR - CBC with Differential/Platelet   Labs pending Health Maintenance reviewed Diet and exercise encouraged  Follow up plan: 3 months    [name], FNP

## 2018-04-18 ENCOUNTER — Other Ambulatory Visit: Payer: Self-pay | Admitting: Family

## 2018-04-18 DIAGNOSIS — E890 Postprocedural hypothyroidism: Secondary | ICD-10-CM

## 2018-04-18 LAB — TSH: TSH: 0.014 u[IU]/mL — AB (ref 0.450–4.500)

## 2018-04-18 LAB — CMP14+EGFR
ALK PHOS: 87 IU/L (ref 39–117)
ALT: 10 IU/L (ref 0–32)
AST: 14 IU/L (ref 0–40)
Albumin/Globulin Ratio: 2 (ref 1.2–2.2)
Albumin: 3.9 g/dL (ref 3.5–5.5)
BUN/Creatinine Ratio: 5 — ABNORMAL LOW (ref 9–23)
BUN: 4 mg/dL — AB (ref 6–24)
Bilirubin Total: 0.3 mg/dL (ref 0.0–1.2)
CALCIUM: 9.2 mg/dL (ref 8.7–10.2)
CO2: 22 mmol/L (ref 20–29)
CREATININE: 0.77 mg/dL (ref 0.57–1.00)
Chloride: 103 mmol/L (ref 96–106)
GFR calc Af Amer: 105 mL/min/{1.73_m2} (ref >59)
GFR calc non Af Amer: 91 mL/min/{1.73_m2} (ref >59)
Globulin, Total: 2 g/dL (ref 1.5–4.5)
Glucose: 87 mg/dL (ref 65–99)
Potassium: 4 mmol/L (ref 3.5–5.2)
Sodium: 138 mmol/L (ref 134–144)
Total Protein: 5.9 g/dL — ABNORMAL LOW (ref 6.0–8.5)

## 2018-04-18 LAB — CBC WITH DIFFERENTIAL/PLATELET
BASOS ABS: 0.1 10*3/uL (ref 0.0–0.2)
Basos: 1 %
EOS (ABSOLUTE): 0.2 10*3/uL (ref 0.0–0.4)
Eos: 2 %
Hematocrit: 39.5 % (ref 34.0–46.6)
Hemoglobin: 13.9 g/dL (ref 11.1–15.9)
Immature Grans (Abs): 0 10*3/uL (ref 0.0–0.1)
Immature Granulocytes: 0 %
Lymphocytes Absolute: 3.3 10*3/uL — ABNORMAL HIGH (ref 0.7–3.1)
Lymphs: 42 %
MCH: 30.8 pg (ref 26.6–33.0)
MCHC: 35.2 g/dL (ref 31.5–35.7)
MCV: 87 fL (ref 79–97)
Monocytes Absolute: 0.7 10*3/uL (ref 0.1–0.9)
Monocytes: 9 %
Neutrophils Absolute: 3.7 10*3/uL (ref 1.4–7.0)
Neutrophils: 46 %
Platelets: 381 10*3/uL (ref 150–450)
RBC: 4.52 x10E6/uL (ref 3.77–5.28)
RDW: 12.1 % — ABNORMAL LOW (ref 12.3–15.4)
WBC: 7.9 10*3/uL (ref 3.4–10.8)

## 2018-06-05 ENCOUNTER — Encounter: Payer: Self-pay | Admitting: Family Medicine

## 2018-06-05 ENCOUNTER — Ambulatory Visit (INDEPENDENT_AMBULATORY_CARE_PROVIDER_SITE_OTHER): Payer: BC Managed Care – PPO | Admitting: Family Medicine

## 2018-06-05 VITALS — BP 118/79 | HR 82 | Temp 98.0°F | Ht 66.0 in | Wt 198.6 lb

## 2018-06-05 DIAGNOSIS — H66002 Acute suppurative otitis media without spontaneous rupture of ear drum, left ear: Secondary | ICD-10-CM | POA: Diagnosis not present

## 2018-06-05 DIAGNOSIS — J069 Acute upper respiratory infection, unspecified: Secondary | ICD-10-CM

## 2018-06-05 DIAGNOSIS — Z8619 Personal history of other infectious and parasitic diseases: Secondary | ICD-10-CM

## 2018-06-05 MED ORDER — FLUCONAZOLE 150 MG PO TABS: 150.0000 mg | ORAL_TABLET | Freq: Once | ORAL | 0 refills | Status: AC

## 2018-06-05 MED ORDER — AMOXICILLIN-POT CLAVULANATE 875-125 MG PO TABS: 1.0000 | ORAL_TABLET | Freq: Two times a day (BID) | ORAL | 0 refills | Status: AC

## 2018-06-05 MED ORDER — PSEUDOEPH-BROMPHEN-DM 30-2-10 MG/5ML PO SYRP: 5.0000 mL | ORAL_SOLUTION | Freq: Four times a day (QID) | ORAL | 0 refills | Status: DC | PRN
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
## 2018-06-05 NOTE — Patient Instructions (Signed)

## 2018-06-11 ENCOUNTER — Ambulatory Visit: Payer: BC Managed Care – PPO | Admitting: "Endocrinology

## 2018-07-21 ENCOUNTER — Ambulatory Visit: Payer: BC Managed Care – PPO | Admitting: Family

## 2018-07-21 ENCOUNTER — Other Ambulatory Visit: Payer: Self-pay

## 2018-07-21 ENCOUNTER — Encounter: Payer: Self-pay | Admitting: Family

## 2018-07-21 VITALS — BP 131/87 | HR 81 | Temp 97.4°F | Ht 66.0 in | Wt 196.0 lb

## 2018-07-21 DIAGNOSIS — F1129 Opioid dependence with unspecified opioid-induced disorder: Secondary | ICD-10-CM

## 2018-07-21 DIAGNOSIS — F902 Attention-deficit hyperactivity disorder, combined type: Secondary | ICD-10-CM

## 2018-07-21 DIAGNOSIS — E669 Obesity, unspecified: Secondary | ICD-10-CM

## 2018-07-21 DIAGNOSIS — F17200 Nicotine dependence, unspecified, uncomplicated: Secondary | ICD-10-CM

## 2018-07-21 DIAGNOSIS — M0579 Rheumatoid arthritis with rheumatoid factor of multiple sites without organ or systems involvement: Secondary | ICD-10-CM | POA: Diagnosis not present

## 2018-07-21 DIAGNOSIS — Z79899 Other long term (current) drug therapy: Secondary | ICD-10-CM | POA: Diagnosis not present

## 2018-07-21 DIAGNOSIS — F411 Generalized anxiety disorder: Secondary | ICD-10-CM

## 2018-07-21 DIAGNOSIS — E890 Postprocedural hypothyroidism: Secondary | ICD-10-CM | POA: Diagnosis not present

## 2018-07-21 MED ORDER — LISDEXAMFETAMINE DIMESYLATE 70 MG PO CAPS: 70.0000 mg | ORAL_CAPSULE | Freq: Every day | ORAL | 0 refills | Status: DC

## 2018-07-21 MED ORDER — ALPRAZOLAM 0.5 MG PO TABS: 0.5000 mg | ORAL_TABLET | Freq: Every evening | ORAL | 4 refills | Status: DC | PRN

## 2018-07-21 NOTE — Addendum Note (Signed)
Addended by: [name] A on: 07/21/2018 09:44 AM   Modules accepted: Orders

## 2018-07-21 NOTE — Patient Instructions (Signed)
Rheumatoid Arthritis  Rheumatoid arthritis (RA) is a long-term (chronic) disease that causes inflammation in your joints. RA may start slowly. It usually affects the small joints of the hands and feet. Usually, the same joints are affected on both sides of your body. Inflammation from RA can also affect other parts of your body, including your heart, eyes, or lungs.  RA is an autoimmune disease. That means that your body's defense system (immune system) mistakenly attacks healthy body tissues. There is no cure for RA, but medicines can help your symptoms and halt or slow down the progression of the disease.  What are the causes?  The exact cause of RA is not known.  What increases the risk?  This condition is more likely to develop in:   Women.   People who have a family history of RA or other autoimmune diseases.  What are the signs or symptoms?  Symptoms of this condition vary from person to person. Symptoms usually start gradually. They are often worse in the morning. The first symptom may be morning stiffness that lasts longer than 30 minutes.  As RA progresses, symptoms may include:   Pain, stiffness, swelling, warmth, and tenderness in joints on both sides of your body.   Loss of energy.   Loss of appetite.   Weight loss.   Low-grade fever.   Dry eyes and dry mouth.   Firm lumps (rheumatoid nodules) that grow beneath your skin in areas such as your forearm bones near your elbows and on your hands.   Changes in the appearance of joints (deformity) and loss of joint function.  Symptoms of RA often come and go. Sometimes, symptoms get worse for a period of time. These are called flares.  How is this diagnosed?  This condition is diagnosed based on your symptoms, medical history, and physical exam. You may have X-rays or MRI to check for the type of joint changes that are caused by RA. You may also have blood tests to look for:   Proteins (antibodies) that your immune system may make if you have RA.  They include rheumatoid factor (RF) and anti-CCP.  ? When blood tests show these proteins, you are said to have "seropositive RA."  ? When blood tests do not show these proteins, you may have "seronegative RA."   Inflammation in your blood.   A low number of red blood cells (anemia).  How is this treated?  The goals of treatment are to relieve pain, reduce inflammation, and slow down or stop joint damage and disability. Treatment may include:   Lifestyle changes. It is important to rest, eat a healthy diet, and exercise.   Medicines. Your health care provider may adjust your medicines every 3 months until treatment goals are reached. Common medicines include:  ? Pain relievers (analgesics).  ? Corticosteroids and NSAIDs to reduce inflammation.  ? Disease-modifying antirheumatic drugs (DMARDs) to try to slow the course of the disease.  ? Biologic response modifiers to reduce inflammation and damage.   Physical therapy and occupational therapy.   Surgery, if you have severe joint damage. Joint replacement or fusing of joints may be needed.  Your health care provider will work with you to identify the best treatment option for you based on assessment of the overall disease activity in your body.  Follow these instructions at home:   Take over-the-counter and prescription medicines only as told by your health care provider.   Start an exercise program as told by your health   care provider.   Rest when you are having a flare.   Return to your normal activities as told by your health care provider. Ask your health care provider what activities are safe for you.   Keep all follow-up visits as told by your health care provider. This is important.  Where to find more information   American College of Rheumatology: www.rheumatology.org   Arthritis Foundation: www.arthritis.org  Contact a health care provider if:   You have a flare-up of RA symptoms.   You have a fever.   You have side effects from your  medicines.  Get help right away if:   You have chest pain.   You have trouble breathing.   You quickly develop a hot, painful joint that is more severe than your usual joint aches.  This information is not intended to replace advice given to you by your health care provider. Make sure you discuss any questions you have with your health care provider.  Document Released: 04/06/2000 Document Revised: 09/20/2016 Document Reviewed: 01/20/2015  [employer]vier Interactive Patient Education  2019 [employer]

## 2018-07-21 NOTE — Progress Notes (Signed)
Subjective:    Patient ID: Jordan Jordan [name], female    DOB: Jan 05, 1971, 48 y.o.   MRN: [id number]  Chief Complaint  Patient presents with  . Medical Management of Chronic Issues   Pt presents to the office today for chronic follow up. She has Rheumatoid Arthritis, but states it has been over a year since her last visit.  Thyroid Problem  Presents for follow-up visit. Symptoms include depressed mood. Patient reports no anxiety, diaphoresis, diarrhea, fatigue or hoarse voice. The symptoms have been stable.  Anxiety  Presents for follow-up visit. Symptoms include decreased concentration, depressed mood, excessive worry, irritability and restlessness. Patient reports no nervous/anxious behavior. Symptoms occur occasionally. The severity of symptoms is moderate. The quality of sleep is good.    Arthritis  Presents for follow-up visit. She complains of pain and stiffness. The symptoms have been stable. Affected locations include the right MCP and left MCP. Her pain is at a severity of 7/10. Pertinent negatives include no diarrhea or fatigue.      Review of Systems  Constitutional: Positive for irritability. Negative for diaphoresis and fatigue.  HENT: Negative for hoarse voice.   Gastrointestinal: Negative for diarrhea.  Musculoskeletal: Positive for arthritis and stiffness.  Psychiatric/Behavioral: Positive for decreased concentration. The patient is not nervous/anxious.   All other systems reviewed and are negative.      Objective:   Physical Exam Vitals signs reviewed.  Constitutional:      Jordan [name]: She is not in acute distress.    Appearance: She is well-developed.  HENT:     Head: Normocephalic and atraumatic.     Right Ear: Tympanic membrane normal.     Left Ear: Tympanic membrane normal.  Eyes:     Pupils: Pupils are equal, round, and reactive to light.  Neck:     Musculoskeletal: Normal range of motion and neck supple.     Thyroid: No thyromegaly.  Cardiovascular:     Rate and Rhythm: Normal rate and regular rhythm.     Heart sounds: Normal heart sounds. No murmur.  Pulmonary:     Effort: Pulmonary effort is normal. No respiratory distress.     Breath sounds: Normal breath sounds. No wheezing.  Abdominal:     Jordan [name]: Bowel sounds are normal. There is no distension.     Palpations: Abdomen is soft.     Tenderness: There is no abdominal tenderness.  Musculoskeletal: Normal range of motion.        Jordan [name]: Swelling (slight swelling in bilateral hands) present. No tenderness.  Skin:    Jordan [name]: Skin is warm and dry.  Neurological:     Mental Status: She is alert and oriented to person, place, and time.     Cranial Nerves: No cranial nerve deficit.     Deep Tendon Reflexes: Reflexes are normal and symmetric.  Psychiatric:        Behavior: Behavior normal.        Thought Content: Thought content normal.        Judgment: Judgment normal.          BP 131/87   Pulse 81   Temp (!) 97.4 F (36.3 C) (Oral)   Ht 5' 6"  (1.676 m)   Wt 196 lb (88.9 kg)   BMI 31.64 kg/m   Assessment & Plan:  Jordan Jordan [name] comes in today with chief complaint of Medical Management of Chronic Issues   Diagnosis and orders addressed:  1. Postoperative hypothyroidism - CMP14+EGFR - CBC with Differential/Platelet -  TSH  2. Rheumatoid arthritis involving multiple sites with positive rheumatoid factor (HCC) - CMP14+EGFR - CBC with Differential/Platelet  3. ADHD (attention deficit hyperactivity disorder), combined type Meds as prescribed Behavior modification as needed Follow-up for recheck in 3 months - CMP14+EGFR - CBC with Differential/Platelet - lisdexamfetamine (VYVANSE) 70 MG capsule; Take 1 capsule (70 mg total) by mouth daily.  Dispense: 30 capsule; Refill: 0 - lisdexamfetamine (VYVANSE) 70 MG capsule; Take 1 capsule (70 mg total) by mouth daily.  Dispense: 30 capsule; Refill: 0 - lisdexamfetamine (VYVANSE) 70 MG capsule; Take 1 capsule (70 mg total)  by mouth daily.  Dispense: 30 capsule; Refill: 0  4. Controlled substance agreement signed - CMP14+EGFR - CBC with Differential/Platelet - lisdexamfetamine (VYVANSE) 70 MG capsule; Take 1 capsule (70 mg total) by mouth daily.  Dispense: 30 capsule; Refill: 0 - lisdexamfetamine (VYVANSE) 70 MG capsule; Take 1 capsule (70 mg total) by mouth daily.  Dispense: 30 capsule; Refill: 0 - lisdexamfetamine (VYVANSE) 70 MG capsule; Take 1 capsule (70 mg total) by mouth daily.  Dispense: 30 capsule; Refill: 0 - ALPRAZolam (XANAX) 0.5 MG tablet; Take 1 tablet (0.5 mg total) by mouth at bedtime as needed for anxiety.  Dispense: 30 tablet; Refill: 4  5. Current smoker - CMP14+EGFR - CBC with Differential/Platelet  6. GAD (generalized anxiety disorder) - CMP14+EGFR - CBC with Differential/Platelet - ALPRAZolam (XANAX) 0.5 MG tablet; Take 1 tablet (0.5 mg total) by mouth at bedtime as needed for anxiety.  Dispense: 30 tablet; Refill: 4  7. Obesity (BMI 30-39.9) - CMP14+EGFR - CBC with Differential/Platelet  8. Opioid dependence with opioid-induced disorder (HCC) - CMP14+EGFR - CBC with Differential/Platelet - lisdexamfetamine (VYVANSE) 70 MG capsule; Take 1 capsule (70 mg total) by mouth daily.  Dispense: 30 capsule; Refill: 0 - lisdexamfetamine (VYVANSE) 70 MG capsule; Take 1 capsule (70 mg total) by mouth daily.  Dispense: 30 capsule; Refill: 0 - lisdexamfetamine (VYVANSE) 70 MG capsule; Take 1 capsule (70 mg total) by mouth daily.  Dispense: 30 capsule; Refill: 0 - ALPRAZolam (XANAX) 0.5 MG tablet; Take 1 tablet (0.5 mg total) by mouth at bedtime as needed for anxiety.  Dispense: 30 tablet; Refill: 4   Labs pending Pt reviewed in [place] controlled database No red flags noted Health Maintenance reviewed Diet and exercise encouraged  Follow up plan: 3 months    [name], FNP

## 2018-07-22 ENCOUNTER — Other Ambulatory Visit: Payer: Self-pay | Admitting: Family

## 2018-07-22 LAB — CMP14+EGFR
ALBUMIN: 4.7 g/dL (ref 3.8–4.8)
ALT: 18 IU/L (ref 0–32)
AST: 17 IU/L (ref 0–40)
Albumin/Globulin Ratio: 2.9 — ABNORMAL HIGH (ref 1.2–2.2)
Alkaline Phosphatase: 90 IU/L (ref 39–117)
BUN/Creatinine Ratio: 9 (ref 9–23)
BUN: 7 mg/dL (ref 6–24)
Bilirubin Total: 0.4 mg/dL (ref 0.0–1.2)
CO2: 23 mmol/L (ref 20–29)
Calcium: 9.7 mg/dL (ref 8.7–10.2)
Chloride: 100 mmol/L (ref 96–106)
Creatinine, Ser: 0.81 mg/dL (ref 0.57–1.00)
GFR calc Af Amer: 99 mL/min/{1.73_m2} (ref >59)
GFR calc non Af Amer: 86 mL/min/{1.73_m2} (ref >59)
GLUCOSE: 96 mg/dL (ref 65–99)
Globulin, Total: 1.6 g/dL (ref 1.5–4.5)
Potassium: 3.8 mmol/L (ref 3.5–5.2)
Sodium: 137 mmol/L (ref 134–144)
Total Protein: 6.3 g/dL (ref 6.0–8.5)

## 2018-07-22 LAB — CBC WITH DIFFERENTIAL/PLATELET
Basophils Absolute: 0.1 10*3/uL (ref 0.0–0.2)
Basos: 1 %
EOS (ABSOLUTE): 0.1 10*3/uL (ref 0.0–0.4)
Eos: 2 %
Hematocrit: 41.2 % (ref 34.0–46.6)
Hemoglobin: 14.8 g/dL (ref 11.1–15.9)
Immature Grans (Abs): 0 10*3/uL (ref 0.0–0.1)
Immature Granulocytes: 0 %
Lymphocytes Absolute: 2.9 10*3/uL (ref 0.7–3.1)
Lymphs: 49 %
MCH: 30.9 pg (ref 26.6–33.0)
MCHC: 35.9 g/dL — ABNORMAL HIGH (ref 31.5–35.7)
MCV: 86 fL (ref 79–97)
Monocytes Absolute: 0.6 10*3/uL (ref 0.1–0.9)
Monocytes: 11 %
NEUTROS PCT: 37 %
Neutrophils Absolute: 2.1 10*3/uL (ref 1.4–7.0)
Platelets: 336 10*3/uL (ref 150–450)
RBC: 4.79 x10E6/uL (ref 3.77–5.28)
RDW: 11.9 % (ref 11.7–15.4)
WBC: 5.8 10*3/uL (ref 3.4–10.8)

## 2018-07-22 LAB — TSH: TSH: 0.015 u[IU]/mL — ABNORMAL LOW (ref 0.450–4.500)

## 2018-07-22 MED ORDER — THYROID 30 MG PO TABS: 30.0000 mg | ORAL_TABLET | Freq: Every day | ORAL | 1 refills | Status: DC

## 2018-07-23 LAB — TOXASSURE SELECT 13 (MW), URINE

## 2018-08-06 ENCOUNTER — Telehealth: Payer: BC Managed Care – PPO | Admitting: Nurse Practitioner

## 2018-08-06 DIAGNOSIS — L247 Irritant contact dermatitis due to plants, except food: Secondary | ICD-10-CM | POA: Diagnosis not present

## 2018-08-06 MED ORDER — PREDNISONE 5 MG (21) PO TBPK: ORAL_TABLET | ORAL | 0 refills | Status: DC

## 2018-08-06 NOTE — Progress Notes (Signed)

## 2018-08-19 ENCOUNTER — Other Ambulatory Visit: Payer: Self-pay | Admitting: Family

## 2018-08-19 DIAGNOSIS — E041 Nontoxic single thyroid nodule: Secondary | ICD-10-CM

## 2018-10-27 ENCOUNTER — Ambulatory Visit (INDEPENDENT_AMBULATORY_CARE_PROVIDER_SITE_OTHER): Payer: BC Managed Care – PPO | Admitting: Family

## 2018-10-27 ENCOUNTER — Other Ambulatory Visit: Payer: Self-pay

## 2018-10-27 ENCOUNTER — Encounter: Payer: Self-pay | Admitting: Family

## 2018-10-27 DIAGNOSIS — E669 Obesity, unspecified: Secondary | ICD-10-CM

## 2018-10-27 DIAGNOSIS — M0579 Rheumatoid arthritis with rheumatoid factor of multiple sites without organ or systems involvement: Secondary | ICD-10-CM

## 2018-10-27 DIAGNOSIS — E890 Postprocedural hypothyroidism: Secondary | ICD-10-CM

## 2018-10-27 DIAGNOSIS — F1129 Opioid dependence with unspecified opioid-induced disorder: Secondary | ICD-10-CM | POA: Diagnosis not present

## 2018-10-27 DIAGNOSIS — F902 Attention-deficit hyperactivity disorder, combined type: Secondary | ICD-10-CM

## 2018-10-27 DIAGNOSIS — F411 Generalized anxiety disorder: Secondary | ICD-10-CM

## 2018-10-27 DIAGNOSIS — F17200 Nicotine dependence, unspecified, uncomplicated: Secondary | ICD-10-CM

## 2018-10-27 DIAGNOSIS — Z79899 Other long term (current) drug therapy: Secondary | ICD-10-CM

## 2018-10-27 MED ORDER — LISDEXAMFETAMINE DIMESYLATE 70 MG PO CAPS: 70.0000 mg | ORAL_CAPSULE | Freq: Every day | ORAL | 0 refills | Status: DC

## 2018-10-27 MED ORDER — ALPRAZOLAM 0.5 MG PO TABS: 0.5000 mg | ORAL_TABLET | Freq: Every evening | ORAL | 4 refills | Status: AC | PRN

## 2018-10-27 NOTE — Progress Notes (Signed)
Virtual Visit via telephone Note  I connected with Jordan [name] on 10/27/18 at 9:46 AM by telephone and verified that I am speaking with the correct person using two identifiers. Jordan [name] is currently located at home and no one is currently with her during visit. The provider, [name], FNP is located in their office at time of visit.  I discussed the limitations, risks, security and privacy concerns of performing an evaluation and management service by telephone and the availability of in person appointments. I also discussed with the patient that there may be a patient responsible charge related to this service. The patient expressed understanding and agreed to proceed.   History and Present Illness:  Pt calls the office today for chronic follow up.She has Rheumatoid Arthritis, but states it has been over a year since her last visit.  Thyroid Problem Presents for follow-up visit. Symptoms include anxiety. Patient reports no cold intolerance, constipation, depressed mood, diarrhea or fatigue. The symptoms have been stable.  Arthritis Presents for follow-up visit. She complains of pain and stiffness. The symptoms have been stable. Affected locations include the right DIP, right MCP, left MCP, left elbow and right elbow. Her pain is at a severity of 6/10. Pertinent negatives include no diarrhea or fatigue.  Anxiety Presents for follow-up visit. Symptoms include excessive worry, insomnia, irritability, nervous/anxious behavior and restlessness. Patient reports no depressed mood. Symptoms occur occasionally. The severity of symptoms is moderate.    ADHD Pt currently taking Vyvanse 70 mg  Daily. States this is helping her stay on task and complete projects once she starts.    Review of Systems  Constitutional: Positive for irritability. Negative for fatigue.  Gastrointestinal: Negative for constipation and diarrhea.  Musculoskeletal: Positive for arthritis and stiffness.    Endo/Heme/Allergies: Negative for cold intolerance.  Psychiatric/Behavioral: The patient is nervous/anxious and has insomnia.      Current opioids rx- Vyvanse 70 mg & Xanax 0.5 mg  # meds rx- 30 & 30 Effectiveness of current meds-Stable Adverse reactions form pain meds-Stable  Morphine equivalent- 0 & 0.27 lME/day  Pill count performed-No Last drug screen - 07/21/18 ( high risk q2m, moderate risk q1m, low risk yearly ) Urine drug screen today- No Was the [place] reviewed- yes  If yes were their any concerning findings? - none  No flowsheet data found.   Pain contract signed on: 07/21/18   Observations/Objective: No SOB or distress noted  Assessment and Plan: Jordan [name] comes in today with chief complaint of No chief complaint on file.   Diagnosis and orders addressed:  1. Postoperative hypothyroidism  2. Rheumatoid arthritis involving multiple sites with positive rheumatoid factor (HCC)  3. Opioid dependence with opioid-induced disorder (HCC) - ALPRAZolam (XANAX) 0.5 MG tablet; Take 1 tablet (0.5 mg total) by mouth at bedtime as needed for anxiety.  Dispense: 30 tablet; Refill: 4 - lisdexamfetamine (VYVANSE) 70 MG capsule; Take 1 capsule (70 mg total) by mouth daily.  Dispense: 30 capsule; Refill: 0 - lisdexamfetamine (VYVANSE) 70 MG capsule; Take 1 capsule (70 mg total) by mouth daily.  Dispense: 30 capsule; Refill: 0 - lisdexamfetamine (VYVANSE) 70 MG capsule; Take 1 capsule (70 mg total) by mouth daily.  Dispense: 30 capsule; Refill: 0  4. Obesity (BMI 30-39.9)  5. GAD (generalized anxiety disorder) - ALPRAZolam (XANAX) 0.5 MG tablet; Take 1 tablet (0.5 mg total) by mouth at bedtime as needed for anxiety.  Dispense: 30 tablet; Refill: 4  6. Current smoker Smoking cessation  discussed  7. Controlled substance agreement signed - ALPRAZolam (XANAX) 0.5 MG tablet; Take 1 tablet (0.5 mg total) by mouth at bedtime as needed for anxiety.  Dispense: 30 tablet;  Refill: 4 - lisdexamfetamine (VYVANSE) 70 MG capsule; Take 1 capsule (70 mg total) by mouth daily.  Dispense: 30 capsule; Refill: 0 - lisdexamfetamine (VYVANSE) 70 MG capsule; Take 1 capsule (70 mg total) by mouth daily.  Dispense: 30 capsule; Refill: 0 - lisdexamfetamine (VYVANSE) 70 MG capsule; Take 1 capsule (70 mg total) by mouth daily.  Dispense: 30 capsule; Refill: 0  8. ADHD (attention deficit hyperactivity disorder), combined type Meds as prescribed Behavior modification as needed Follow-up for recheck in 3 months - lisdexamfetamine (VYVANSE) 70 MG capsule; Take 1 capsule (70 mg total) by mouth daily.  Dispense: 30 capsule; Refill: 0 - lisdexamfetamine (VYVANSE) 70 MG capsule; Take 1 capsule (70 mg total) by mouth daily.  Dispense: 30 capsule; Refill: 0 - lisdexamfetamine (VYVANSE) 70 MG capsule; Take 1 capsule (70 mg total) by mouth daily.  Dispense: 30 capsule; Refill: 0   Labs reviewed- Need to retest TSH Health Maintenance reviewed Diet and exercise encouraged  Follow up plan: 3 months     I discussed the assessment and treatment plan with the patient. The patient was provided an opportunity to ask questions and all were answered. The patient agreed with the plan and demonstrated an understanding of the instructions.   The patient was advised to call back or seek an in-person evaluation if the symptoms worsen or if the condition fails to improve as anticipated.  The above assessment and management plan was discussed with the patient. The patient verbalized understanding of and has agreed to the management plan. Patient is aware to call the clinic if symptoms persist or worsen. Patient is aware when to return to the clinic for a follow-up visit. Patient educated on when it is appropriate to go to the emergency department.   Time call ended:  10:02 AM  I provided 16 minutes of non-face-to-face time during this encounter.    [name], FNP

## 2019-01-16 ENCOUNTER — Telehealth: Payer: Self-pay | Admitting: *Deleted

## 2019-01-16 NOTE — Telephone Encounter (Addendum)
Prior Auth for Vyvanse 70mg  caps-APPROVED  01/16/2019 - 01/15/2022  Key: [id number] -   PA Case ID: [id number]  Your information has been submitted to [facility]. To check for an updated outcome later, reopen this PA request from your dashboard.  If [employer] has not responded to your request within 24 hours, contact [facility] at [phone]. If you think there may be a problem with your PA request, use our live chat feature at the bottom right.  Pharmacy notified

## 2019-01-29 ENCOUNTER — Ambulatory Visit: Payer: Self-pay | Admitting: Family

## 2019-03-16 ENCOUNTER — Other Ambulatory Visit: Payer: Self-pay | Admitting: Obstetrics and Gynecology

## 2019-03-16 DIAGNOSIS — Z1509 Genetic susceptibility to other malignant neoplasm: Secondary | ICD-10-CM

## 2019-04-11 ENCOUNTER — Other Ambulatory Visit: Payer: Self-pay

## 2019-04-11 ENCOUNTER — Ambulatory Visit
Admission: RE | Admit: 2019-04-11 | Discharge: 2019-04-11 | Disposition: A | Discharge status: Home / Self Care | Payer: BC Managed Care – PPO | Source: Ambulatory Visit | Attending: Obstetrics and Gynecology | Admitting: Obstetrics and Gynecology

## 2019-04-11 DIAGNOSIS — Z1509 Genetic susceptibility to other malignant neoplasm: Secondary | ICD-10-CM

## 2019-04-11 MED ORDER — GADOBUTROL 1 MMOL/ML IV SOLN
9.0000 mL | Freq: Once | INTRAVENOUS | Status: AC | PRN
  Administered 2019-04-11: 09:00:00 9 mL via INTRAVENOUS

## 2019-04-25 ENCOUNTER — Other Ambulatory Visit: Payer: Self-pay | Admitting: Family

## 2019-04-25 DIAGNOSIS — F411 Generalized anxiety disorder: Secondary | ICD-10-CM

## 2019-05-01 ENCOUNTER — Ambulatory Visit: Payer: BC Managed Care – PPO | Admitting: Gastroenterology

## 2019-05-05 ENCOUNTER — Ambulatory Visit: Payer: BC Managed Care – PPO | Admitting: Gastroenterology

## 2019-05-29 ENCOUNTER — Ambulatory Visit: Payer: BC Managed Care – PPO | Admitting: Gastroenterology

## 2019-05-29 ENCOUNTER — Other Ambulatory Visit: Payer: Self-pay | Admitting: Gastroenterology

## 2019-05-29 ENCOUNTER — Encounter: Payer: Self-pay | Admitting: Gastroenterology

## 2019-05-29 VITALS — BP 120/82 | HR 89 | Temp 97.9°F | Ht 66.0 in | Wt 216.0 lb

## 2019-05-29 DIAGNOSIS — Z01818 Encounter for other preprocedural examination: Secondary | ICD-10-CM

## 2019-05-29 DIAGNOSIS — Z1509 Genetic susceptibility to other malignant neoplasm: Secondary | ICD-10-CM | POA: Diagnosis not present

## 2019-05-29 MED ORDER — SUPREP BOWEL PREP KIT 17.5-3.13-1.6 GM/177ML PO SOLN: 1.0000 | ORAL | 0 refills | Status: DC

## 2019-05-29 NOTE — Patient Instructions (Signed)
If you are age 51 or older, your body mass index should be between 23-30. Your Body mass index is 34.86 kg/m. If this is out of the aforementioned range listed, please consider follow up with your Primary Care Provider.  If you are age 44 or younger, your body mass index should be between 19-25. Your Body mass index is 34.86 kg/m. If this is out of the aformentioned range listed, please consider follow up with your Primary Care Provider.   You have been scheduled for an endoscopy and colonoscopy. Please follow the written instructions given to you at your visit today. Please pick up your prep supplies at the pharmacy within the next 1-3 days. If you use inhalers (even only as needed), please bring them with you on the day of your procedure.  Due to recent changes in healthcare laws, you may see the results of your imaging and laboratory studies on MyChart before your provider has had a chance to review them.  We understand that in some cases there may be results that are confusing or concerning to you. Not all laboratory results come back in the same time frame and the provider may be waiting for multiple results in order to interpret others.  Please give [place] 48 hours in order for your provider to thoroughly review all the results before contacting the office for clarification of your results.   Thank you, Dr Ardis Hughs
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
## 2019-06-11 ENCOUNTER — Encounter: Payer: Self-pay | Admitting: Gastroenterology

## 2019-06-19 ENCOUNTER — Encounter: Payer: BC Managed Care – PPO | Admitting: Gastroenterology

## 2020-02-18 ENCOUNTER — Other Ambulatory Visit: Payer: Self-pay | Admitting: Obstetrics and Gynecology

## 2020-02-18 DIAGNOSIS — N644 Mastodynia: Secondary | ICD-10-CM

## 2020-11-14 ENCOUNTER — Other Ambulatory Visit (HOSPITAL_COMMUNITY): Payer: Self-pay | Admitting: Sports Medicine

## 2020-11-14 ENCOUNTER — Ambulatory Visit (HOSPITAL_COMMUNITY)
Admission: RE | Admit: 2020-11-14 | Discharge: 2020-11-14 | Disposition: A | Discharge status: Home / Self Care | Payer: BC Managed Care – PPO | Source: Ambulatory Visit | Attending: Sports Medicine | Admitting: Sports Medicine

## 2020-11-14 ENCOUNTER — Other Ambulatory Visit: Payer: Self-pay

## 2020-11-14 DIAGNOSIS — M7989 Other specified soft tissue disorders: Secondary | ICD-10-CM

## 2020-11-14 DIAGNOSIS — M79662 Pain in left lower leg: Secondary | ICD-10-CM | POA: Insufficient documentation

## 2020-11-14 NOTE — Progress Notes (Signed)
Left lower extremity venous duplex completed. Refer to "CV Proc" under chart review to view preliminary results.  Preliminary results discussed with Wynell Balloon of Dr. Jeoffrey Massed office.  11/14/2020 4:45 PM Kelby Aline., MHA, RVT, RDCS, RDMS

## 2021-09-22 ENCOUNTER — Ambulatory Visit (HOSPITAL_COMMUNITY)
Admission: RE | Admit: 2021-09-22 | Discharge: 2021-09-22 | Disposition: A | Discharge status: Home / Self Care | Payer: BC Managed Care – PPO | Source: Ambulatory Visit | Attending: Cardiology | Admitting: Cardiology

## 2021-09-22 ENCOUNTER — Other Ambulatory Visit (HOSPITAL_COMMUNITY): Payer: Self-pay | Admitting: Sports Medicine

## 2021-09-22 DIAGNOSIS — M79662 Pain in left lower leg: Secondary | ICD-10-CM | POA: Diagnosis not present

## 2021-09-22 DIAGNOSIS — M7989 Other specified soft tissue disorders: Secondary | ICD-10-CM | POA: Diagnosis not present

## 2023-07-11 ENCOUNTER — Encounter: Payer: Self-pay | Admitting: Gastroenterology

## 2023-09-09 ENCOUNTER — Encounter: Payer: Self-pay | Admitting: Gastroenterology

## 2023-09-09 ENCOUNTER — Ambulatory Visit (INDEPENDENT_AMBULATORY_CARE_PROVIDER_SITE_OTHER): Payer: Self-pay | Admitting: Gastroenterology

## 2023-09-09 VITALS — BP 100/64 | HR 115 | Ht 66.0 in | Wt 198.0 lb

## 2023-09-09 DIAGNOSIS — Z800 Family history of malignant neoplasm of digestive organs: Secondary | ICD-10-CM | POA: Insufficient documentation

## 2023-09-09 DIAGNOSIS — K5900 Constipation, unspecified: Secondary | ICD-10-CM | POA: Diagnosis not present

## 2023-09-09 DIAGNOSIS — Z1509 Genetic susceptibility to other malignant neoplasm: Secondary | ICD-10-CM | POA: Diagnosis not present

## 2023-09-09 MED ORDER — METOCLOPRAMIDE HCL 10 MG PO TABS: ORAL_TABLET | ORAL | 0 refills | Status: AC

## 2023-09-09 MED ORDER — NA SULFATE-K SULFATE-MG SULF 17.5-3.13-1.6 GM/177ML PO SOLN: 1.0000 | Freq: Once | ORAL | 0 refills | Status: AC

## 2023-09-09 NOTE — Patient Instructions (Signed)
 You have been scheduled for an endoscopy and colonoscopy. Please follow the written instructions given to you at your visit today.  If you use inhalers (even only as needed), please bring them with you on the day of your procedure.  DO NOT TAKE 7 DAYS PRIOR TO TEST- Trulicity (dulaglutide) Ozempic, Wegovy (semaglutide) Mounjaro (tirzepatide) Bydureon Bcise (exanatide extended release)  DO NOT TAKE 1 DAY PRIOR TO YOUR TEST Rybelsus (semaglutide) Adlyxin (lixisenatide) Victoza (liraglutide) Byetta (exanatide) _______________________________________________________________   If your blood pressure at your visit was 140/90 or greater, please contact your primary care physician to follow up on this.  _______________________________________________________  If you are age 52 or older, your body mass index should be between 23-30. Your Body mass index is 31.96 kg/m. If this is out of the aforementioned range listed, please consider follow up with your Primary Care Provider.  If you are age 65 or younger, your body mass index should be between 19-25. Your Body mass index is 31.96 kg/m. If this is out of the aformentioned range listed, please consider follow up with your Primary Care Provider.   ________________________________________________________  The [facility] providers would like to encourage you to use MYCHART to communicate with providers for non-urgent requests or questions.  Due to long hold times on the telephone, sending your provider a message by [facility] may be a faster and more efficient way to get a response.  Please allow 48 business hours for a response.  Please remember that this is for non-urgent requests.  _______________________________________________________
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
## 2023-09-12 NOTE — Progress Notes (Signed)
 ____________________________________________________________  Attending physician addendum:  Thank you for sending this case to me. I have reviewed the entire note and agree with the plan.  She also needs regular follow-up with gynecology for Lynch syndrome related screening of pelvic malignancies. (Copied this to primary care so they can refer patient to a gynecologist if she does not already have one)  Lorella Roles, MD  ____________________________________________________________

## 2023-09-17 NOTE — Progress Notes (Signed)
 Left message for patient to call office.

## 2023-09-20 ENCOUNTER — Encounter: Payer: Self-pay | Admitting: Gastroenterology

## 2023-09-30 ENCOUNTER — Encounter: Payer: Self-pay | Admitting: Gastroenterology

## 2023-09-30 ENCOUNTER — Ambulatory Visit: Admitting: Gastroenterology

## 2023-09-30 VITALS — BP 112/78 | HR 78 | Temp 98.1°F | Resp 19 | Ht 66.0 in | Wt 198.0 lb

## 2023-09-30 DIAGNOSIS — K5730 Diverticulosis of large intestine without perforation or abscess without bleeding: Secondary | ICD-10-CM

## 2023-09-30 DIAGNOSIS — Z1211 Encounter for screening for malignant neoplasm of colon: Secondary | ICD-10-CM

## 2023-09-30 DIAGNOSIS — K209 Esophagitis, unspecified without bleeding: Secondary | ICD-10-CM

## 2023-09-30 DIAGNOSIS — Z1509 Genetic susceptibility to other malignant neoplasm: Secondary | ICD-10-CM

## 2023-09-30 DIAGNOSIS — Z800 Family history of malignant neoplasm of digestive organs: Secondary | ICD-10-CM | POA: Diagnosis not present

## 2023-09-30 DIAGNOSIS — K648 Other hemorrhoids: Secondary | ICD-10-CM

## 2023-09-30 NOTE — Progress Notes (Unsigned)
 Pt's states no medical or surgical changes since previsit or office visit.

## 2023-09-30 NOTE — Op Note (Addendum)
 [facility] Patient Name: Jordan [name] Procedure Date: 09/30/2023 3:45 PM MRN: [id number] Endoscopist: Ace Abu L. Dominic Friendly , MD, [id number] Age: 53 Referring MD:  Date of Birth: 31-Aug-1970 Gender: Female Account #: [id number] Procedure:                Upper GI endoscopy Indications:              Hereditary nonpolyposis colorectal cancer (Lynch                            Syndrome) Medicines:                Monitored Anesthesia Care Procedure:                Pre-Anesthesia Assessment:                           - Prior to the procedure, a History and Physical                            was performed, and patient medications and                            allergies were reviewed. The patient's tolerance of                            previous anesthesia was also reviewed. The risks                            and benefits of the procedure and the sedation                            options and risks were discussed with the patient.                            All questions were answered, and informed consent                            was obtained. Prior Anticoagulants: The patient has                            taken no anticoagulant or antiplatelet agents. ASA                            Grade Assessment: II - A patient with mild systemic                            disease. After reviewing the risks and benefits,                            the patient was deemed in satisfactory condition to                            undergo the procedure.  After obtaining informed consent, the endoscope was                            passed under direct vision. Throughout the                            procedure, the patient's blood pressure, pulse, and                            oxygen saturations were monitored continuously. The                            Olympus Scope [id number] was introduced through the                            mouth, and advanced to the third part of  duodenum.                            The upper GI endoscopy was accomplished without                            difficulty. The patient tolerated the procedure                            well. Scope In: Scope Out: Findings:                 The larynx was normal.                           LA Grade A (one or more mucosal breaks less than 5                            mm, not extending between tops of 2 mucosal folds)                            esophagitis was found at the gastroesophageal                            junction.                           The exam of the esophagus was otherwise normal.                           The stomach was normal. (examined under [facility] and [facility])                           The cardia and gastric fundus were normal on                            retroflexion.                           The examined duodenum was normal. Complications:  No immediate complications. Estimated Blood Loss:     Estimated blood loss: none. Impression:               - Normal larynx.                           - LA Grade A reflux esophagitis.                           - Normal stomach.                           - Normal examined duodenum.                           - No specimens collected. Recommendation:           - Patient has a contact number available for                            emergencies. The signs and symptoms of potential                            delayed complications were discussed with the                            patient. Return to normal activities tomorrow.                            Written discharge instructions were provided to the                            patient.                           - Resume previous diet.                           - Continue present medications.                           - Over the counter omeprazole 20 mg once daily x                            4-6 weeks for reflux esophagitis seen today.                           Return to our  office as needed if ongoing symptoms                            of GERD (heartburn, regurgitation)                           - Repeat upper endoscopy in 3 years. [name] L. Dominic Friendly, MD 09/30/2023 4:31:15 PM This report has been signed electronically.

## 2023-09-30 NOTE — Progress Notes (Unsigned)
1515 Robinul 0.1 mg IV given due large amount of secretions upon assessment.  MD made aware, vss

## 2023-09-30 NOTE — Op Note (Signed)
 [facility] Patient Name: Jordan [name] Procedure Date: 09/30/2023 3:46 PM MRN: [id number] Endoscopist: Ace Abu L. Dominic Friendly , MD, [id number] Age: 53 Referring MD:  Date of Birth: 01-Jul-1970 Gender: Female Account #: [id number] Procedure:                Colonoscopy Indications:              Screening in patient at increased risk: Colorectal                            cancer in brother before age 51,                           personal Hx of Lynch Syndrome (MSH6) Medicines:                Monitored Anesthesia Care Procedure:                Pre-Anesthesia Assessment:                           - Prior to the procedure, a History and Physical                            was performed, and patient medications and                            allergies were reviewed. The patient's tolerance of                            previous anesthesia was also reviewed. The risks                            and benefits of the procedure and the sedation                            options and risks were discussed with the patient.                            All questions were answered, and informed consent                            was obtained. Prior Anticoagulants: The patient has                            taken no anticoagulant or antiplatelet agents. ASA                            Grade Assessment: II - A patient with mild systemic                            disease. After reviewing the risks and benefits,                            the patient was deemed in satisfactory condition to  undergo the procedure.                           After obtaining informed consent, the colonoscope                            was passed under direct vision. Throughout the                            procedure, the patient's blood pressure, pulse, and                            oxygen saturations were monitored continuously. The                            CF [id number] #[id number] was introduced  through the anus                            and advanced to the the cecum, identified by                            appendiceal orifice and ileocecal valve. The                            colonoscopy was performed without difficulty. The                            patient tolerated the procedure well. The quality                            of the bowel preparation was good. The ileocecal                            valve, appendiceal orifice, and rectum were                            photographed. Scope In: 3:59:53 PM Scope Out: 4:11:58 PM Scope Withdrawal Time: 0 hours 8 minutes 51 seconds  Total Procedure Duration: 0 hours 12 minutes 5 seconds  Findings:                 The perianal and digital rectal examinations were                            normal.                           Repeat examination of right colon under NBI                            performed.                           Multiple diverticula were found in the left colon  and right colon.                           Internal hemorrhoids were found. The hemorrhoids                            were small.                           The exam was otherwise without abnormality on                            direct and retroflexion views. Complications:            No immediate complications. Estimated Blood Loss:     Estimated blood loss: none. Impression:               - Diverticulosis in the left colon and in the right                            colon.                           - Internal hemorrhoids.                           - The examination was otherwise normal on direct                            and retroflexion views.                           - No specimens collected. Recommendation:           - Patient has a contact number available for                            emergencies. The signs and symptoms of potential                            delayed complications were discussed with the                             patient. Return to normal activities tomorrow.                            Written discharge instructions were provided to the                            patient.                           - Resume previous diet.                           - Continue present medications.                           - Repeat  colonoscopy in 18 months for screening                            purposes.                           - See the other procedure note for documentation of                            additional recommendations. Samhitha Rosen L. Dominic Friendly, MD 09/30/2023 4:15:56 PM This report has been signed electronically.

## 2023-09-30 NOTE — Progress Notes (Unsigned)
 Report given to PACU, vss

## 2023-09-30 NOTE — Progress Notes (Unsigned)
 No significant changes to clinical history since GI office visit on 09/09/23.  The patient is appropriate for an endoscopic procedure in the ambulatory setting.  - Lorella Roles, MD

## 2023-09-30 NOTE — Patient Instructions (Signed)
 Upper endoscopy Resume previous diet Continue present medications Over the counter omeprazole - 20 mg once daily for 4-6 weeks for reflux esophagitis  Colonoscopy Resume previous diet Continue present medications  Repeat colonoscopy in 18 months for screening purposes   YOU HAD AN ENDOSCOPIC PROCEDURE TODAY AT THE [facility]:   Refer to the procedure report that was given to you for any specific questions about what was found during the examination.  If the procedure report does not answer your questions, please call your gastroenterologist to clarify.  If you requested that your care partner not be given the details of your procedure findings, then the procedure report has been included in a sealed envelope for you to review at your convenience later.  YOU SHOULD EXPECT: Some feelings of bloating in the abdomen. Passage of more gas than usual.  Walking can help get rid of the air that was put into your GI tract during the procedure and reduce the bloating. If you had a lower endoscopy (such as a colonoscopy or flexible sigmoidoscopy) you may notice spotting of blood in your stool or on the toilet paper. If you underwent a bowel prep for your procedure, you may not have a normal bowel movement for a few days.  Please Note:  You might notice some irritation and congestion in your nose or some drainage.  This is from the oxygen used during your procedure.  There is no need for concern and it should clear up in a day or so.  SYMPTOMS TO REPORT IMMEDIATELY:  Following lower endoscopy (colonoscopy or flexible sigmoidoscopy):  Excessive amounts of blood in the stool  Significant tenderness or worsening of abdominal pains  Swelling of the abdomen that is new, acute  Fever of 100F or higher  Following upper endoscopy (EGD)  Vomiting of blood or coffee ground material  New chest pain or pain under the shoulder blades  Painful or persistently difficult swallowing  New shortness of  breath  Fever of 100F or higher  Black, tarry-looking stools  For urgent or emergent issues, a gastroenterologist can be reached at any hour by calling [phone]. Do not use MyChart messaging for urgent concerns.    DIET:  We do recommend a small meal at first, but then you may proceed to your regular diet.  Drink plenty of fluids but you should avoid alcoholic beverages for 24 hours.  ACTIVITY:  You should plan to take it easy for the rest of today and you should NOT DRIVE or use heavy machinery until tomorrow (because of the sedation medicines used during the test).    FOLLOW UP: Our staff will call the number listed on your records the next business day following your procedure.  We will call around 7:15- 8:00 am to check on you and address any questions or concerns that you may have regarding the information given to you following your procedure. If we do not reach you, we will leave a message.     If any biopsies were taken you will be contacted by phone or by letter within the next 1-3 weeks.  Please call us  at [phone] if you have not heard about the biopsies in 3 weeks.    SIGNATURES/CONFIDENTIALITY: You and/or your care partner have signed paperwork which will be entered into your electronic medical record.  These signatures attest to the fact that that the information above on your After Visit Summary has been reviewed and is understood.  Full responsibility of the  confidentiality of this discharge information lies with you and/or your care-partner.

## 2023-10-01 ENCOUNTER — Telehealth: Payer: Self-pay

## 2023-10-01 NOTE — Telephone Encounter (Signed)
  Follow up Call-     09/30/2023    2:42 PM  Call back number  Post procedure Call Back phone  # [phone]  Permission to leave phone message Yes     Patient questions:  Do you have a fever, pain , or abdominal swelling? No. Pain Score  0 *  Have you tolerated food without any problems? Yes.    Have you been able to return to your normal activities? Yes.    Do you have any questions about your discharge instructions: Diet   No. Medications  No. Follow up visit  No.  Do you have questions or concerns about your Care? No.  Actions: * If pain score is 4 or above: No action needed, pain <4.
